# Patient Record
Sex: Male | Born: 1995 | Race: White | Hispanic: No | Marital: Single | Smoking: Never smoker
Health system: Southern US, Community
[De-identification: ages and names within clinical notes are randomized; demographics above are authoritative.]

## PROBLEM LIST (undated history)

## (undated) DIAGNOSIS — D332 Benign neoplasm of brain, unspecified: Secondary | ICD-10-CM

## (undated) DIAGNOSIS — I639 Cerebral infarction, unspecified: Secondary | ICD-10-CM

## (undated) DIAGNOSIS — E079 Disorder of thyroid, unspecified: Secondary | ICD-10-CM

## (undated) HISTORY — PX: VENTRICULOPERITONEAL SHUNT: SHX204

---
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
## 2019-10-02 ENCOUNTER — Other Ambulatory Visit: Payer: Self-pay

## 2019-10-02 ENCOUNTER — Ambulatory Visit: Payer: Self-pay | Attending: Internal Medicine

## 2019-10-02 DIAGNOSIS — Z20822 Contact with and (suspected) exposure to covid-19: Secondary | ICD-10-CM | POA: Insufficient documentation

## 2019-10-03 ENCOUNTER — Telehealth: Payer: Self-pay

## 2019-10-03 LAB — NOVEL CORONAVIRUS, NAA: SARS-CoV-2, NAA: NOT DETECTED

## 2019-10-03 NOTE — Telephone Encounter (Signed)
Provided covid results voiced understanding.

## 2020-12-26 ENCOUNTER — Ambulatory Visit (INDEPENDENT_AMBULATORY_CARE_PROVIDER_SITE_OTHER): Payer: Self-pay

## 2020-12-26 ENCOUNTER — Encounter: Payer: Self-pay | Admitting: Emergency Medicine

## 2020-12-26 ENCOUNTER — Ambulatory Visit
Admission: EM | Admit: 2020-12-26 | Discharge: 2020-12-26 | Disposition: A | Discharge status: Home / Self Care | Payer: Self-pay

## 2020-12-26 DIAGNOSIS — M25471 Effusion, right ankle: Secondary | ICD-10-CM

## 2020-12-26 DIAGNOSIS — M25571 Pain in right ankle and joints of right foot: Secondary | ICD-10-CM

## 2020-12-26 DIAGNOSIS — W19XXXA Unspecified fall, initial encounter: Secondary | ICD-10-CM

## 2020-12-26 DIAGNOSIS — S93491A Sprain of other ligament of right ankle, initial encounter: Secondary | ICD-10-CM

## 2020-12-26 HISTORY — DX: Disorder of thyroid, unspecified: E07.9

## 2020-12-26 HISTORY — DX: Cerebral infarction, unspecified: I63.9

## 2020-12-26 NOTE — Discharge Instructions (Addendum)
Continue icing R ankle for 15 minutes 4 times today and tomorrow Elevate when possible  Follow up with [facility] next week if you are not getting better

## 2020-12-26 NOTE — ED Triage Notes (Signed)
Fell yesterday and while playing ball yesterday, twisted right ankle again on the same day.  Right ankle swelling.

## 2021-09-18 ENCOUNTER — Emergency Department (HOSPITAL_COMMUNITY): Payer: Self-pay

## 2021-09-18 ENCOUNTER — Emergency Department (HOSPITAL_COMMUNITY)
Admission: EM | Admit: 2021-09-18 | Discharge: 2021-09-18 | Disposition: A | Discharge status: Home / Self Care | Payer: Self-pay | Attending: Emergency Medicine | Admitting: Emergency Medicine

## 2021-09-18 ENCOUNTER — Encounter (HOSPITAL_COMMUNITY): Payer: Self-pay | Admitting: *Deleted

## 2021-09-18 DIAGNOSIS — R1032 Left lower quadrant pain: Secondary | ICD-10-CM | POA: Insufficient documentation

## 2021-09-18 DIAGNOSIS — M545 Low back pain, unspecified: Secondary | ICD-10-CM | POA: Insufficient documentation

## 2021-09-18 DIAGNOSIS — E876 Hypokalemia: Secondary | ICD-10-CM | POA: Insufficient documentation

## 2021-09-18 DIAGNOSIS — R110 Nausea: Secondary | ICD-10-CM | POA: Insufficient documentation

## 2021-09-18 DIAGNOSIS — Z79899 Other long term (current) drug therapy: Secondary | ICD-10-CM | POA: Insufficient documentation

## 2021-09-18 LAB — URINALYSIS, ROUTINE W REFLEX MICROSCOPIC
Bilirubin Urine: NEGATIVE
Glucose, UA: NEGATIVE mg/dL
Hgb urine dipstick: NEGATIVE
Ketones, ur: NEGATIVE mg/dL
Leukocytes,Ua: NEGATIVE
Nitrite: NEGATIVE
Protein, ur: NEGATIVE mg/dL
Specific Gravity, Urine: 1.01 (ref 1.005–1.030)
pH: 7 (ref 5.0–8.0)

## 2021-09-18 LAB — CBC WITH DIFFERENTIAL/PLATELET
Abs Immature Granulocytes: 0.02 10*3/uL (ref 0.00–0.07)
Basophils Absolute: 0 10*3/uL (ref 0.0–0.1)
Basophils Relative: 0 %
Eosinophils Absolute: 0.1 10*3/uL (ref 0.0–0.5)
Eosinophils Relative: 2 %
HCT: 44 % (ref 39.0–52.0)
Hemoglobin: 15.7 g/dL (ref 13.0–17.0)
Immature Granulocytes: 0 %
Lymphocytes Relative: 37 %
Lymphs Abs: 2.2 10*3/uL (ref 0.7–4.0)
MCH: 31.5 pg (ref 26.0–34.0)
MCHC: 35.7 g/dL (ref 30.0–36.0)
MCV: 88.2 fL (ref 80.0–100.0)
Monocytes Absolute: 0.4 10*3/uL (ref 0.1–1.0)
Monocytes Relative: 7 %
Neutro Abs: 3.1 10*3/uL (ref 1.7–7.7)
Neutrophils Relative %: 54 %
Platelets: 254 10*3/uL (ref 150–400)
RBC: 4.99 MIL/uL (ref 4.22–5.81)
RDW: 12.7 % (ref 11.5–15.5)
WBC: 5.8 10*3/uL (ref 4.0–10.5)
nRBC: 0 % (ref 0.0–0.2)

## 2021-09-18 LAB — COMPREHENSIVE METABOLIC PANEL
ALT: 26 U/L (ref 0–44)
AST: 20 U/L (ref 15–41)
Albumin: 4.4 g/dL (ref 3.5–5.0)
Alkaline Phosphatase: 81 U/L (ref 38–126)
Anion gap: 8 (ref 5–15)
BUN: 7 mg/dL (ref 6–20)
CO2: 30 mmol/L (ref 22–32)
Calcium: 8.7 mg/dL — ABNORMAL LOW (ref 8.9–10.3)
Chloride: 102 mmol/L (ref 98–111)
Creatinine, Ser: 0.79 mg/dL (ref 0.61–1.24)
GFR, Estimated: >60 mL/min (ref >60)
Glucose, Bld: 86 mg/dL (ref 70–99)
Potassium: 3.1 mmol/L — ABNORMAL LOW (ref 3.5–5.1)
Sodium: 140 mmol/L (ref 135–145)
Total Bilirubin: 0.7 mg/dL (ref 0.3–1.2)
Total Protein: 7.8 g/dL (ref 6.5–8.1)

## 2021-09-18 LAB — LIPASE, BLOOD: Lipase: 43 U/L (ref 11–51)

## 2021-09-18 MED ORDER — IBUPROFEN 600 MG PO TABS: 600.0000 mg | ORAL_TABLET | Freq: Four times a day (QID) | ORAL | 0 refills | Status: AC | PRN

## 2021-09-18 MED ORDER — IOHEXOL 300 MG/ML  SOLN
100.0000 mL | Freq: Once | INTRAMUSCULAR | Status: AC | PRN
  Administered 2021-09-18: 100 mL via INTRAVENOUS

## 2021-09-18 MED ORDER — LACTATED RINGERS IV BOLUS
1000.0000 mL | Freq: Once | INTRAVENOUS | Status: AC
  Administered 2021-09-18: 1000 mL via INTRAVENOUS

## 2021-09-18 MED ORDER — METHOCARBAMOL 500 MG PO TABS: 500.0000 mg | ORAL_TABLET | Freq: Two times a day (BID) | ORAL | 0 refills | Status: AC

## 2021-09-18 MED ORDER — MORPHINE SULFATE (PF) 4 MG/ML IV SOLN
4.0000 mg | Freq: Once | INTRAVENOUS | Status: AC
  Administered 2021-09-18: 4 mg via INTRAVENOUS
  Filled 2021-09-18: qty 1

## 2021-09-18 NOTE — ED Provider Notes (Signed)
[facility] EMERGENCY DEPARTMENT Provider Note   CSN: [id number] Arrival date & time: 09/18/21  1223     History Chief Complaint  Patient presents with   Abdominal Pain    Derrick Morrison is a 26 y.o. male with history of stroke and hypopituitarism presents to the ED for evaluation of left lower back pain and LLQ pain since yesterday. The patient reports that he doesn't know where the pain is coming from, but keeps moving "back and forth". Family member in the room mentions that he had a bowel movement on himself yesterday which is not typical for him. The patient reports nausea and belching. Denies any vomiting. Denies any dysuria, hematuria, testicular pain/swelling, scrotal pain/swelling, penile pain/swelling. Denies any urinary incontinence or retention. FM denies any fevers. FM reports that he gave him "some gas pills" earlier today with no relief of pain. The patient has right upper extremitiy deficits at baseline from previous stroke. Medical history as listed above. Multiple brain surgeries for tumor removal. Daily medication include synthyroid and lamosil. NKDA. Up to date on childhood vaccinations. Denies any tobacco, EtOH, or illicit drug use ever.    Abdominal Pain Associated symptoms: nausea   Associated symptoms: no chest pain, no constipation, no dysuria, no fever, no hematuria, no shortness of breath and no vomiting       Home Medications Prior to Admission medications   Medication Sig Start Date End Date Taking? Authorizing Provider  levothyroxine (SYNTHROID) 50 MCG tablet Take 50 mcg by mouth daily before breakfast.    [provider]      Allergies    Patient has no known allergies.    Review of Systems   Review of Systems  Constitutional:  Negative for fever.  HENT:  Negative for congestion and rhinorrhea.   Respiratory:  Negative for shortness of breath.   Cardiovascular:  Negative for chest pain.  Gastrointestinal:  Positive for abdominal pain and  nausea. Negative for blood in stool, constipation and vomiting.  Genitourinary:  Negative for dysuria, frequency, hematuria, penile discharge, penile pain, penile swelling, scrotal swelling and testicular pain.  Musculoskeletal:  Positive for back pain.  All other systems reviewed and are negative.  Physical Exam Updated Vital Signs BP (!) 128/96 (BP Location: Left Arm)    Pulse 88    Temp (!) 97.4 F (36.3 C) (Oral)    Resp 20    SpO2 100%  Physical Exam Vitals and nursing note reviewed.  Constitutional:      General: He is not in acute distress.    Appearance: Normal appearance. He is not toxic-appearing.  HENT:     Head: Normocephalic and atraumatic.  Eyes:     General: No scleral icterus. Cardiovascular:     Rate and Rhythm: Normal rate and regular rhythm.  Pulmonary:     Effort: Pulmonary effort is normal. No respiratory distress.     Breath sounds: Normal breath sounds.  Abdominal:     General: Abdomen is flat. Bowel sounds are normal.     Palpations: Abdomen is soft.     Tenderness: There is no abdominal tenderness.     Comments: No overlying skin changes, erythema, ecchymosis, rash, or abrasion noted to the area. Mild LLQ tenderness to palpation. No rebounding or guarding. NBS. Abdomen is soft and non distended.   Musculoskeletal:        General: No deformity.     Cervical back: Normal range of motion.     Comments: No midlines or paraspinal  tenderness to palpation of the cervical, thoracic, lumbar, or sacrum. No overlying skin changes visualized. No bony deformities or step offs palpated or visualized. No CVA tenderness.   Skin:    General: Skin is warm and dry.  Neurological:     Mental Status: He is alert. Mental status is at baseline.     Comments: RUE contracture and weakness. Strength 5/5 in LUE and bilateral lower extremities. DP and PT pulses intact. Sensation intact. Compartments are soft.     ED Results / Procedures / Treatments   Labs (all labs ordered are  listed, but only abnormal results are displayed) Labs Reviewed  COMPREHENSIVE METABOLIC PANEL - Abnormal; Notable for the following components:      Result Value   Potassium 3.1 (*)    Calcium 8.7 (*)    All other components within normal limits  LIPASE, BLOOD  CBC WITH DIFFERENTIAL/PLATELET  URINALYSIS, ROUTINE W REFLEX MICROSCOPIC    EKG None  Radiology CT ABDOMEN PELVIS W CONTRAST  Result Date: 09/18/2021 CLINICAL DATA:  Left lower quadrant abdominal pain EXAM: CT ABDOMEN AND PELVIS WITH CONTRAST TECHNIQUE: Multidetector CT imaging of the abdomen and pelvis was performed using the standard protocol following bolus administration of intravenous contrast. RADIATION DOSE REDUCTION: This exam was performed according to the departmental dose-optimization program which includes automated exposure control, adjustment of the mA and/or kV according to patient size and/or use of iterative reconstruction technique. CONTRAST:  134mL OMNIPAQUE IOHEXOL 300 MG/ML  SOLN COMPARISON:  None. FINDINGS: Lower chest: No acute abnormality. Hepatobiliary: Diffuse low attenuation of hepatic parenchyma concerning for hepatic steatosis. No focal hepatic lesion. No gallstones, gallbladder wall thickening, or biliary dilatation. Pancreas: Unremarkable. No pancreatic ductal dilatation or surrounding inflammatory changes. Spleen: Normal in size without focal abnormality. Adrenals/Urinary Tract: Adrenal glands are unremarkable. Kidneys are normal, without renal calculi, focal lesion, or hydronephrosis. Bladder is unremarkable. Stomach/Bowel: Stomach is within normal limits. Appendix appears normal. No evidence of bowel wall thickening, distention, or inflammatory changes. Vascular/Lymphatic: No significant vascular findings are present. No enlarged abdominal or pelvic lymph nodes. Reproductive: Prostate is unremarkable. Other: VP shunt with distal tip in the left anterior pelvis. No abdominal wall hernia. No ascites.  Musculoskeletal: No acute or significant osseous findings. IMPRESSION: 1.  No CT evidence of acute abdominal/pelvic process. 2.  Hepatic steatosis. 3. VP shunt with distal tip in the left anterior pelvis without evidence of complications. Electronically Signed   By: Keane Police D.O.   On: 09/18/2021 16:49    Procedures Procedures  Mildly elevated BP, hemodynamically stable.   Medications Ordered in ED Medications  lactated ringers bolus 1,000 mL (0 mLs Intravenous Stopped 09/18/21 1645)  morphine 4 MG/ML injection 4 mg (4 mg Intravenous Given 09/18/21 1534)  iohexol (OMNIPAQUE) 300 MG/ML solution 100 mL (100 mLs Intravenous Contrast Given 09/18/21 1617)    ED Course/ Medical Decision Making/ A&P                           Medical Decision Making  26 y/o M presents to the ED for evaluation of abdominal pain/back pain. Differential diagnosis includes but is lot limited to colitis, diverticulitis, viral gastroenteritis, SBO, sciatica, cauda equina. Low suspicion for cauda equina as he moves all extremities spontaneously and he doesn't endorse any midline tenderness. Vital signs show mild elevated blood pressure, otherwise unremarkable. Physical exam pertinent for some mild LLQ tenderness to palpation.  Otherwise, no paraspinal or midline tenderness palpation.  No overlying skin lesions noted to the abdomen or back.  Normal active bowel sounds.  Abdomen soft and nondistended.  Will order basic labs and imaging. Morphine and fluids given.  I ordered and independently interpreted the patient's labs.  CBC shows no leukocytosis or anemia.  Lipase normal. Urinalysis normal. CMP shows mild hypokalemia at 3.1 and mild hypocalcemia at 8.7 otherwise no other electrolyte abnormalities.   I ordered and independently reviewed the patient's CT abdomen pelvis and agree with the radiologist's findings.  No evidence of acute abdominal process.  No abnormality seen in the left lower quadrant to explain pain.  Radiologist finding of: VP shunt with distal tip in the left anterior pelvis without evidence of complications.  As I return to the room to discussed the lab and imaging findings with the patient and family members, one of the members reports that they forgot to mention that he did in fact fall yesterday.  Because of his throat, he does not have a steady gait.  The primary mentions that he was in a camper and tripped landing on his left side.  Given this new information along with benign imaging and labs, this is likely musculoskeletal in etiology.  Patient does not meet admission criteria.  We will place the patient on ibuprofen and Robaxin.  Return precautions discussed with family members and patient.  They agree to plan.  Patient is stable and being discharged home in good condition.  Final Clinical Impression(s) / ED Diagnoses Final diagnoses:  Left lower quadrant abdominal pain  Acute left-sided low back pain without sciatica    Rx / DC Orders ED Discharge Orders          Ordered    ibuprofen (ADVIL) 600 MG tablet  Every 6 hours PRN        09/18/21 1836    methocarbamol (ROBAXIN) 500 MG tablet  2 times daily        09/18/21 1836              Sherrell Puller, PA-C 09/20/21 2354    Noemi Chapel, MD 09/23/21 [phone]

## 2021-09-18 NOTE — ED Triage Notes (Signed)
Left lower quadrant abdominal pain onset last night

## 2021-09-18 NOTE — Discharge Instructions (Addendum)
You were seen here today for evaluation of your back and abdominal pain. Your labs and imaging were unremarkable. I have prescribed you ibuprofen and robaxin to take as needed for pain. If you have any worsening pain, bowel or bladder incontinence, fevers, or worsening weakness, please return to the nearest ER for re-evaluation.

## 2021-12-26 ENCOUNTER — Encounter (HOSPITAL_COMMUNITY): Payer: Self-pay

## 2021-12-26 ENCOUNTER — Emergency Department (HOSPITAL_COMMUNITY)
Admission: EM | Admit: 2021-12-26 | Discharge: 2021-12-26 | Disposition: A | Discharge status: Home / Self Care | Payer: Self-pay | Attending: Emergency Medicine | Admitting: Emergency Medicine

## 2021-12-26 ENCOUNTER — Emergency Department (HOSPITAL_COMMUNITY): Payer: Self-pay

## 2021-12-26 ENCOUNTER — Other Ambulatory Visit: Payer: Self-pay

## 2021-12-26 DIAGNOSIS — H53459 Other localized visual field defect, unspecified eye: Secondary | ICD-10-CM | POA: Insufficient documentation

## 2021-12-26 DIAGNOSIS — E876 Hypokalemia: Secondary | ICD-10-CM | POA: Insufficient documentation

## 2021-12-26 DIAGNOSIS — R0789 Other chest pain: Secondary | ICD-10-CM | POA: Insufficient documentation

## 2021-12-26 DIAGNOSIS — R519 Headache, unspecified: Secondary | ICD-10-CM | POA: Insufficient documentation

## 2021-12-26 DIAGNOSIS — Q670 Congenital facial asymmetry: Secondary | ICD-10-CM | POA: Insufficient documentation

## 2021-12-26 DIAGNOSIS — M436 Torticollis: Secondary | ICD-10-CM | POA: Insufficient documentation

## 2021-12-26 DIAGNOSIS — R278 Other lack of coordination: Secondary | ICD-10-CM | POA: Insufficient documentation

## 2021-12-26 DIAGNOSIS — R2689 Other abnormalities of gait and mobility: Secondary | ICD-10-CM | POA: Insufficient documentation

## 2021-12-26 DIAGNOSIS — J029 Acute pharyngitis, unspecified: Secondary | ICD-10-CM | POA: Insufficient documentation

## 2021-12-26 HISTORY — DX: Benign neoplasm of brain, unspecified: D33.2

## 2021-12-26 LAB — BASIC METABOLIC PANEL
Anion gap: 8 (ref 5–15)
BUN: 8 mg/dL (ref 6–20)
CO2: 28 mmol/L (ref 22–32)
Calcium: 8.8 mg/dL — ABNORMAL LOW (ref 8.9–10.3)
Chloride: 106 mmol/L (ref 98–111)
Creatinine, Ser: 0.6 mg/dL — ABNORMAL LOW (ref 0.61–1.24)
GFR, Estimated: >60 mL/min (ref >60)
Glucose, Bld: 75 mg/dL (ref 70–99)
Potassium: 3.1 mmol/L — ABNORMAL LOW (ref 3.5–5.1)
Sodium: 142 mmol/L (ref 135–145)

## 2021-12-26 LAB — CBC WITH DIFFERENTIAL/PLATELET
Abs Immature Granulocytes: 0.01 10*3/uL (ref 0.00–0.07)
Basophils Absolute: 0 10*3/uL (ref 0.0–0.1)
Basophils Relative: 0 %
Eosinophils Absolute: 0.1 10*3/uL (ref 0.0–0.5)
Eosinophils Relative: 2 %
HCT: 41.8 % (ref 39.0–52.0)
Hemoglobin: 14.5 g/dL (ref 13.0–17.0)
Immature Granulocytes: 0 %
Lymphocytes Relative: 40 %
Lymphs Abs: 2.4 10*3/uL (ref 0.7–4.0)
MCH: 30.1 pg (ref 26.0–34.0)
MCHC: 34.7 g/dL (ref 30.0–36.0)
MCV: 86.9 fL (ref 80.0–100.0)
Monocytes Absolute: 0.4 10*3/uL (ref 0.1–1.0)
Monocytes Relative: 7 %
Neutro Abs: 3.1 10*3/uL (ref 1.7–7.7)
Neutrophils Relative %: 51 %
Platelets: 229 10*3/uL (ref 150–400)
RBC: 4.81 MIL/uL (ref 4.22–5.81)
RDW: 12.4 % (ref 11.5–15.5)
WBC: 6 10*3/uL (ref 4.0–10.5)
nRBC: 0 % (ref 0.0–0.2)

## 2021-12-26 LAB — TROPONIN I (HIGH SENSITIVITY)
Troponin I (High Sensitivity): <2 ng/L (ref <18)
Troponin I (High Sensitivity): <2 ng/L (ref <18)

## 2021-12-26 MED ORDER — POTASSIUM CHLORIDE CRYS ER 10 MEQ PO TBCR: 20.0000 meq | EXTENDED_RELEASE_TABLET | Freq: Two times a day (BID) | ORAL | 0 refills | Status: AC

## 2021-12-26 MED ORDER — POTASSIUM CHLORIDE CRYS ER 20 MEQ PO TBCR
40.0000 meq | EXTENDED_RELEASE_TABLET | Freq: Once | ORAL | Status: AC
  Administered 2021-12-26: 40 meq via ORAL
  Filled 2021-12-26: qty 2

## 2021-12-26 MED ORDER — IOHEXOL 300 MG/ML  SOLN
75.0000 mL | Freq: Once | INTRAMUSCULAR | Status: AC | PRN
  Administered 2021-12-26: 75 mL via INTRAVENOUS

## 2021-12-26 NOTE — ED Triage Notes (Addendum)
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  VP shunt goes around right side of neck.  Patient is seen at [facility].  Patient is complaining of sore throat and had migraine yesterday.  Just finished antbx and steriods for sinus infection.  Pain in his neck when he turns his head to the left.  Mother reports having cp pain and difficulty taking deep breath. ?Mother reports he is never sick and has been the past week.  Patient complains of sternal pain.  Mother is requesting head CT to evaluate shunt.  ?

## 2021-12-26 NOTE — ED Provider Notes (Signed)
?[facility] ?Provider Note ? ? ?CSN: [id number] ?Arrival date & time: 12/26/21  1306 ? ?  ? ?History ? ?Chief Complaint  ?Patient presents with  ? Sore Throat  ? Torticollis  ? ? ?Derrick Morrison is a 26 y.o. male with chief complaint of sore throat and migraine.  Recently had a sinus infection over the last week and just finished antibiotics/steroids for this.  Denies cough, fever, postnasal drip.  Endorses mild lingering congestion and new sore throat that began yesterday.  Notes intermittent difficulty swallowing, and the feeling as if there is a lump in his throat.  Feels as though the left side of his throat is tender and swollen, and that the lump is palpable.  Noted that he has headaches that restarted 2 days ago, and usually when he has these he has to have his shunt rechecked per patient's mother.  Denies vision changes, balance changes, or other new neurodeficits.  Denies hearing changes or ear pain.  Denies recent trauma or cervical neck pain.  Also complaining of intermittent, non-characterized sternal pain and pain upon inspiration.  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  Currently followed by [facility]. ? ?The history is provided by the patient and medical records.  ?Sore Throat ?Associated symptoms include headaches.  ? ?  ? ?Home Medications ?Prior to Admission medications   ?Medication Sig Start Date End Date Taking? Authorizing Provider  ?ibuprofen (ADVIL) 600 MG tablet Take 1 tablet (600 mg total) by mouth every 6 (six) hours as needed. 09/18/21  Yes Sherrell Puller, PA-C  ?levothyroxine (SYNTHROID) 50 MCG tablet Take 50 mcg by mouth daily before breakfast.   Yes [provider]  ?pantoprazole (PROTONIX) 20 MG tablet Take 20 mg by mouth daily. 12/21/21  Yes [provider]  ?potassium chloride SA (KLOR-CON M) 10 MEQ tablet Take 2 tablets (20 mEq total) by  mouth 2 (two) times daily for 4 days. 12/26/21 12/30/21 Yes Prince Rome, PA-C  ?methocarbamol (ROBAXIN) 500 MG tablet Take 1 tablet (500 mg total) by mouth 2 (two) times daily. ?Patient not taking: Reported on 12/26/2021 09/18/21   Sherrell Puller, PA-C  ?   ? ?Allergies    ?Codeine   ? ?Review of Systems   ?Review of Systems  ?HENT:  Positive for congestion, sore throat and trouble swallowing (Intermittent).   ?Neurological:  Positive for headaches.  ? ?Physical Exam ?Updated Vital Signs ?BP (!) 132/91   Pulse 87   Temp 97.6 ?F (36.4 ?C) (Oral)   Resp 18   Wt 87.8 kg   SpO2 99%  ?Physical Exam ?Vitals and nursing note reviewed.  ?Constitutional:   ?   General: He is not in acute distress. ?   Appearance: He is well-developed. He is not ill-appearing or diaphoretic.  ?   Comments: Per family members patient appears at baseline  ?HENT:  ?   Head: Normocephalic and atraumatic.  ? ?   Comments: Head pain localized during recent headaches per patient as depicted above, not worsened with palpation ?   Nose: No congestion or rhinorrhea.  ?   Mouth/Throat:  ?   Mouth: Mucous membranes are moist.  ?   Pharynx: Oropharynx is clear. Uvula midline. No pharyngeal swelling, oropharyngeal exudate, posterior oropharyngeal erythema or uvula swelling.  ?   Tonsils: No tonsillar exudate or tonsillar abscesses.  ?Eyes:  ?   General: Lids are normal. Vision  grossly intact. Visual field deficit present.  ?   Extraocular Movements:  ?   Right eye: Abnormal extraocular motion present.  ?   Left eye: Abnormal extraocular motion present.  ?   Conjunctiva/sclera: Conjunctivae normal.  ?   Right eye: Right conjunctiva is not injected.  ?   Left eye: Left conjunctiva is not injected.  ?   Comments: Abnormal EOMs at baseline, pre-existing CN III palsy ?Right eyelid droops at baseline  ?Cardiovascular:  ?   Rate and Rhythm: Normal rate and regular rhythm.  ?   Heart sounds: Normal heart sounds. No murmur heard. ?Pulmonary:  ?   Effort:  Pulmonary effort is normal. No respiratory distress.  ?   Breath sounds: Normal breath sounds. No wheezing.  ?Chest:  ?   Chest wall: Tenderness present. No mass, lacerations, deformity, swelling, crepitus or edema.  ? ? ?   Comments: Chest TTP as depicted above ?Abdominal:  ?   Palpations: Abdomen is soft.  ?   Tenderness: There is no abdominal tenderness.  ?Musculoskeletal:     ?   General: No swelling.  ?   Cervical back: Neck supple.  ?Lymphadenopathy:  ?   Cervical: Cervical adenopathy (Left cervical) present.  ?Skin: ?   General: Skin is warm and dry.  ?   Capillary Refill: Capillary refill takes less than 2 seconds.  ?Neurological:  ?   Mental Status: He is alert and oriented to person, place, and time.  ?   GCS: GCS eye subscore is 4. GCS verbal subscore is 5. GCS motor subscore is 6.  ?   Cranial Nerves: Cranial nerve deficit and facial asymmetry present. No dysarthria.  ?   Sensory: Sensation is intact. No sensory deficit.  ?   Motor: Weakness present. No tremor or seizure activity.  ?   Coordination: Coordination abnormal.  ?   Gait: Gait abnormal.  ?   Comments: Facial asymmetry, visual field deficits, weakness of the right upper and lower extremity, abnormal coordination of the right upper and lower extremity, and abnormal gait are all pre-existing and to the level of baseline per patient family members.  Difficult to distinguish new from old neurodeficits.  ?Psychiatric:     ?   Mood and Affect: Mood normal.  ? ? ?ED Results / Procedures / Treatments   ?Labs ?(all labs ordered are listed, but only abnormal results are displayed) ?Labs Reviewed  ?BASIC METABOLIC PANEL - Abnormal; Notable for the following components:  ?    Result Value  ? Potassium 3.1 (*)   ? Creatinine, Ser 0.60 (*)   ? Calcium 8.8 (*)   ? All other components within normal limits  ?CBC WITH DIFFERENTIAL/PLATELET  ?TROPONIN I (HIGH SENSITIVITY)  ?TROPONIN I (HIGH SENSITIVITY)  ? ? ?EKG ?EKG Interpretation ? ?Date/Time:  Saturday December 26 2021 13:17:20 EDT ?Ventricular Rate:  93 ?PR Interval:  146 ?QRS Duration: 82 ?QT Interval:  374 ?QTC Calculation: 465 ?R Axis:   -25 ?Text Interpretation: Normal sinus rhythm with sinus arrhythmia Normal ECG No previous ECGs available Confirmed by Noemi Chapel [phone]) on 12/26/2021 1:21:55 PM ? ?Radiology ?CT Head Wo Contrast ? ?Result Date: 12/26/2021 ?CLINICAL DATA:  Headache. EXAM: CT HEAD WITHOUT CONTRAST TECHNIQUE: Contiguous axial images were obtained from the base of the skull through the vertex without intravenous contrast. RADIATION DOSE REDUCTION: This exam was performed according to the departmental dose-optimization program which includes automated exposure control, adjustment of the mA and/or kV according to patient  size and/or use of iterative reconstruction technique. COMPARISON:  July 01, 2006 FINDINGS: Brain: No evidence of acute infarction, hemorrhage, or hydrocephalus. VP shunt in stable position. Stable ventricular system. 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 Prominence of the right internal carotid artery, stable. Stable lacunar infarct of the left thalamus. Vascular: No unexpected calcification. Skull: Negative for fracture or focal lesion. Sinuses/Orbits: No acute finding. Other: None. IMPRESSION: 1. No acute intracranial abnormality. 2. VP shunt in stable position. Stable ventricular system. 3. Stable suprasellar cystic lesion. 4. Prominence of the right internal carotid artery, stable. Electronically Signed   By: Fidela Salisbury M.D.   On: 12/26/2021 17:17  ? ?CT Soft Tissue Neck W Contrast ? ?Result Date: 12/26/2021 ?CLINICAL DATA:  Provided history: Soft tissue swelling, infection suspected, neck x-ray done. Additional history provided: Assess for retropharyngeal abscess, parapharyngeal abscess, tonsillar abscess. History of brain tumor. EXAM: CT NECK WITH CONTRAST TECHNIQUE: Multidetector CT imaging of the neck was performed using the standard protocol following the bolus  administration of intravenous contrast. RADIATION DOSE REDUCTION: This exam was performed according to the departmental dose-optimization program which includes automated exposure control, adjustment of the mA

## 2021-12-26 NOTE — Discharge Instructions (Signed)
Please follow-up with your primary care within the next 3 to 5 days for reevaluation and continued medical management ? ?Also follow-up with your neurology specialist at [facility] as discussed within the next week or so ? ?Return to the ED for new or worsening symptoms as discussed ?

## 2022-11-25 IMAGING — CT CT HEAD W/O CM
3 of 4 series · 15 of 47 positions shown, 18 images · non-contrast
Comparison: July 01, 2006

CLINICAL DATA: Headache.



[Series 3: head w o · axial · 0.46mm/px · z∈[-22,+118]mm · 9 of 34 slices shown, 12 images]
[im 3/34  brain]
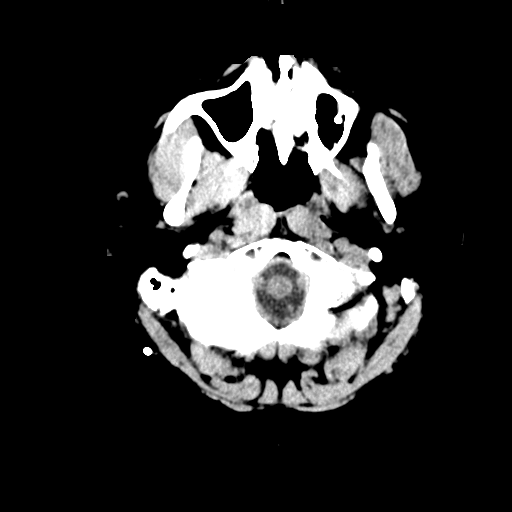
[im 3/34  bone]
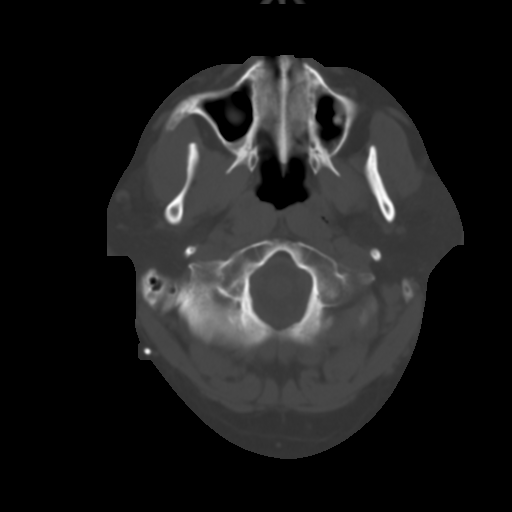
[im 8/34  brain]
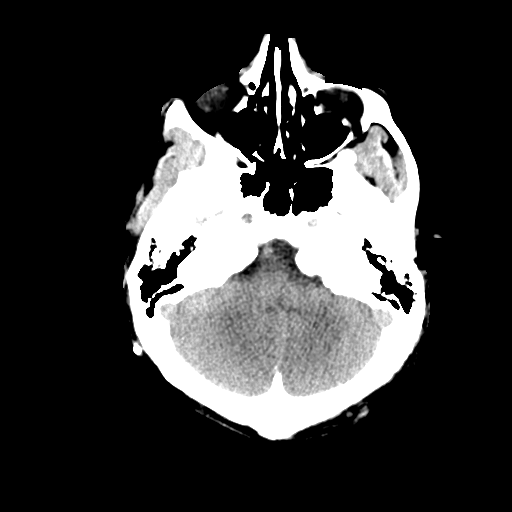
[im 10/34  brain]
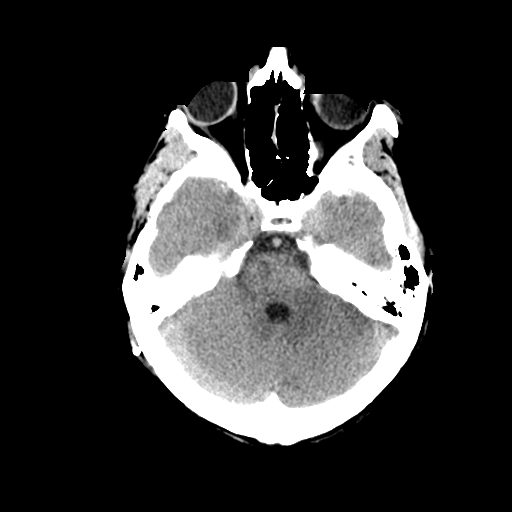
[im 15/34  brain]
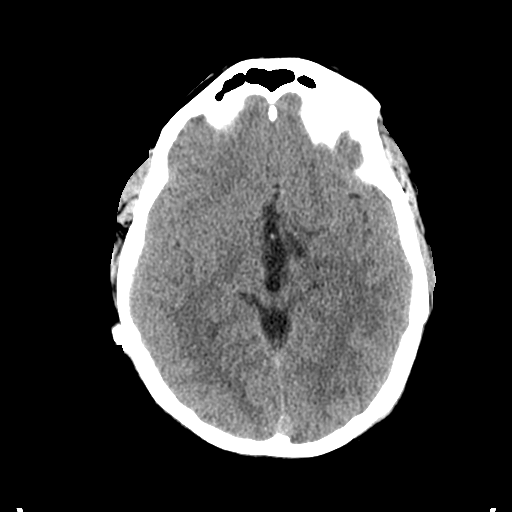
[im 17/34  brain]
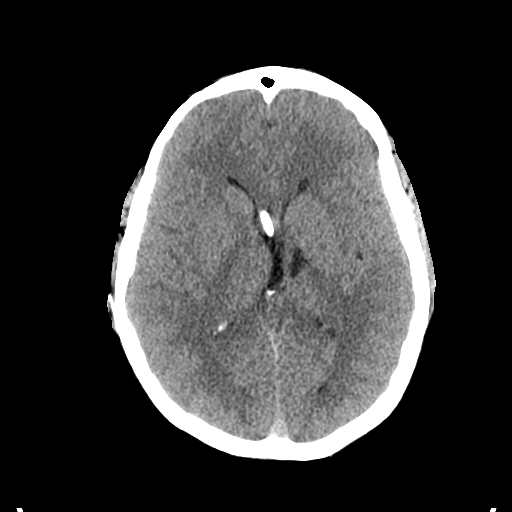
[im 17/34  bone]
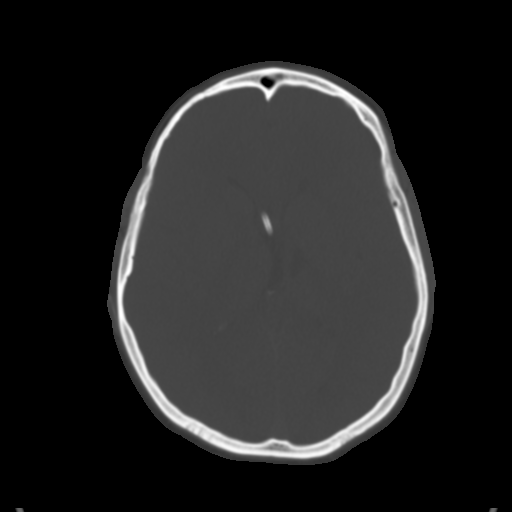
[im 19/34  brain]
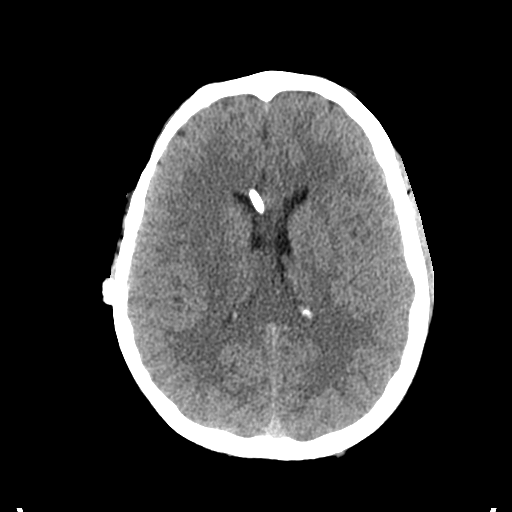
[im 24/34  brain]
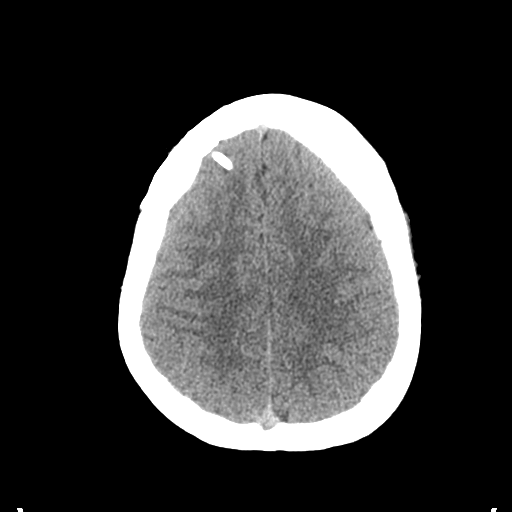
[im 26/34  brain]
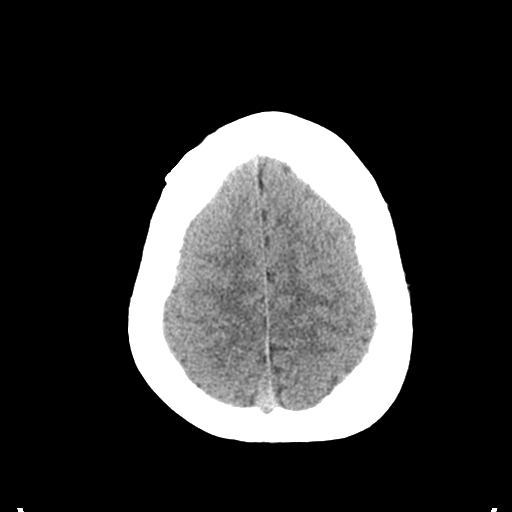
[im 31/34  brain]
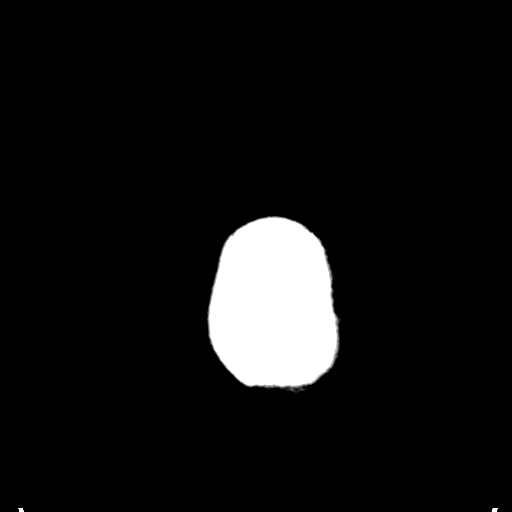
[im 31/34  bone]
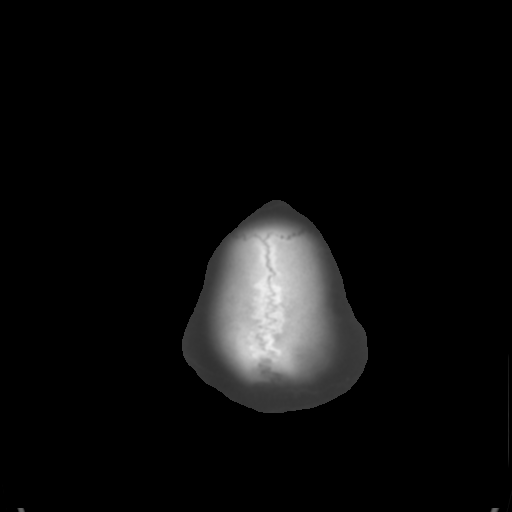

[Series 4: coronal soft · coronal · 0.33mm/px · 3 of 74 slices shown]
[im 25/74  brain]
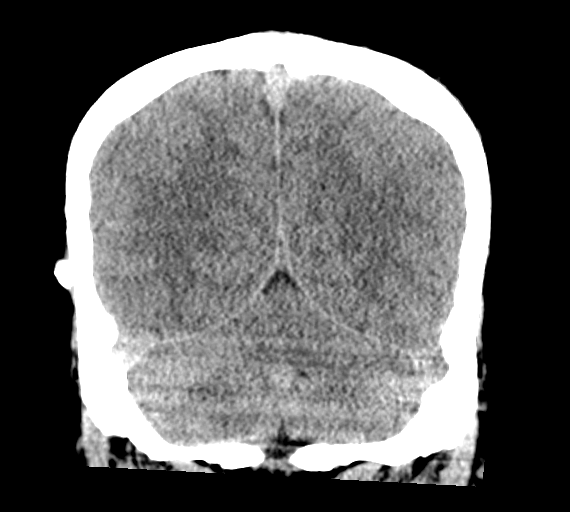
[im 33/74  brain]
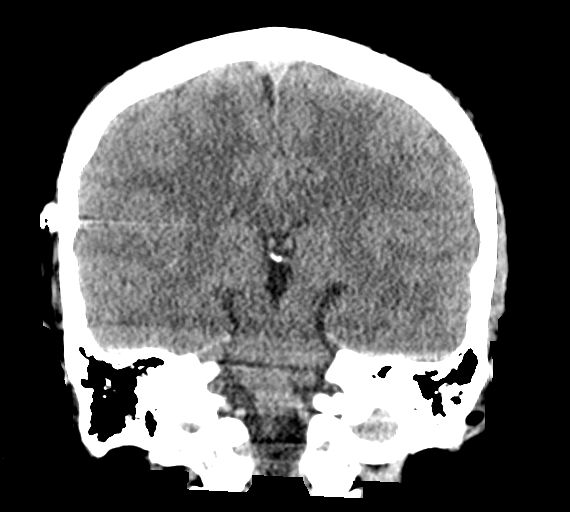
[im 41/74  brain]
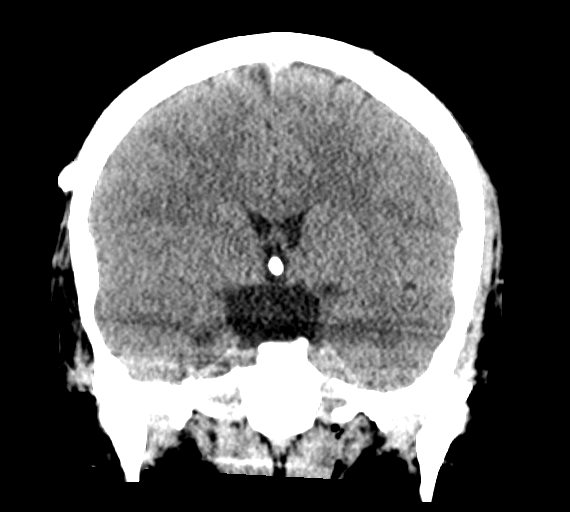

[Series 5: sagittal soft · sagittal · 0.34mm/px · 3 of 62 slices shown]
[im 21/62  brain]
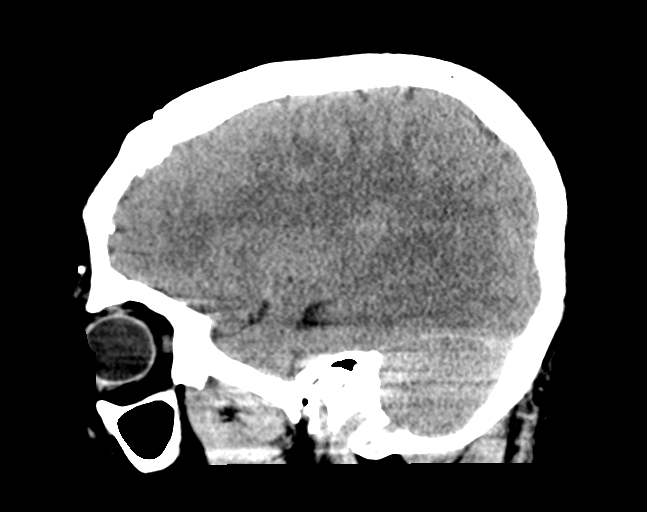
[im 31/62  brain]
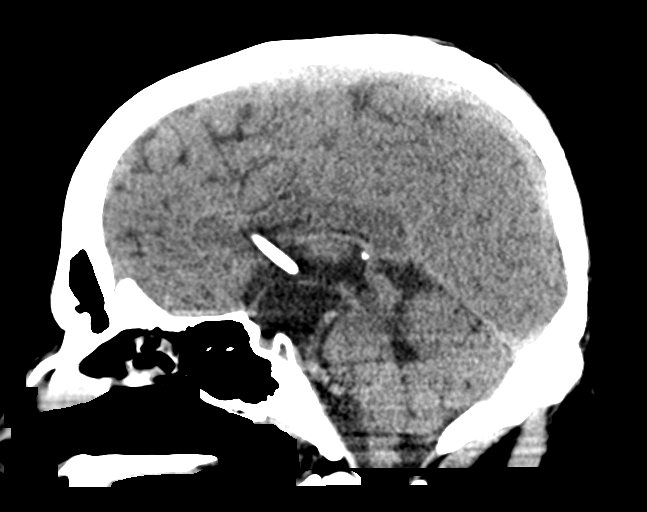
[im 41/62  brain]
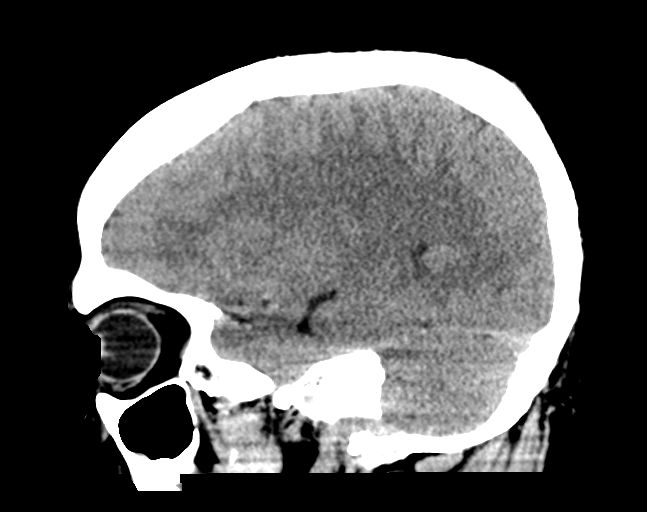

[15 of 47 positions shown; findings below may reference images not displayed]

FINDINGS: Brain: No evidence of acute infarction, hemorrhage, or
hydrocephalus. VP shunt in stable position. Stable ventricular
system. The suprasellar cystic lesion is unchanged. Prominence of
the right internal carotid artery, stable. Stable lacunar infarct of
the left thalamus.

Vascular: No unexpected calcification.

Skull: Negative for fracture or focal lesion.

Sinuses/Orbits: No acute finding.

Other: None.
IMPRESSION: 1. No acute intracranial abnormality.
2. VP shunt in stable position. Stable ventricular system.
3. Stable suprasellar cystic lesion.
4. Prominence of the right internal carotid artery, stable.

## 2022-11-25 IMAGING — CT CT NECK W/ CM
4 of 5 series · 14 of 35 positions shown, 16 images · IV contrast (Omnipaque or Isovue)
Comparison: None.

CLINICAL DATA: Provided history: Soft tissue swelling, infection
suspected, neck x-ray done. Additional history provided: Assess for
retropharyngeal abscess, parapharyngeal abscess, tonsillar abscess.
History of brain tumor.

EXAM:
CT NECK WITH CONTRAST
TECHNIQUE: Multidetector CT imaging of the neck was performed using the
standard protocol following the bolus administration of intravenous
contrast.

[Series 4: axial neck · axial · 0.51mm/px · z∈[-171,-63]mm · 3 of 110 slices shown]
[im 28/110  bone]
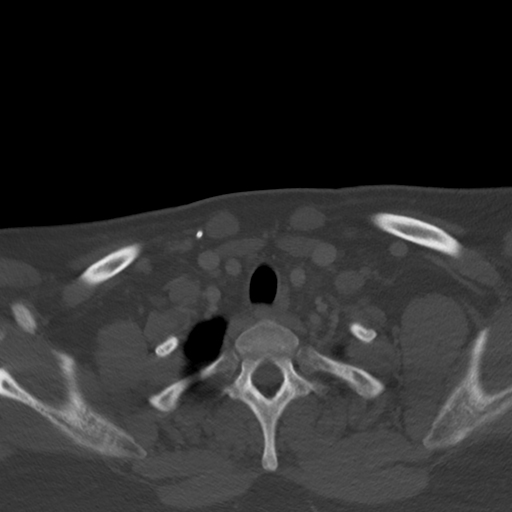
[im 55/110  bone]
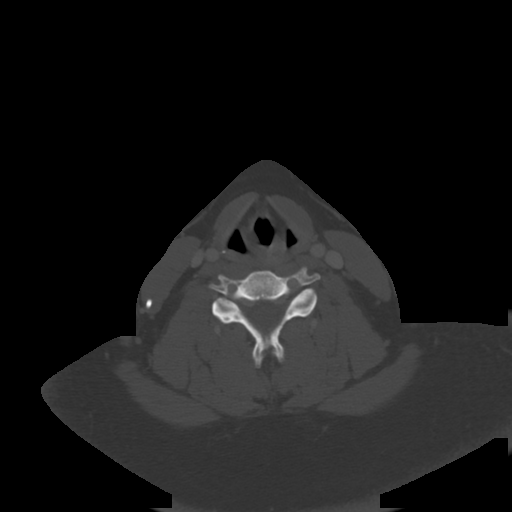
[im 82/110  bone]
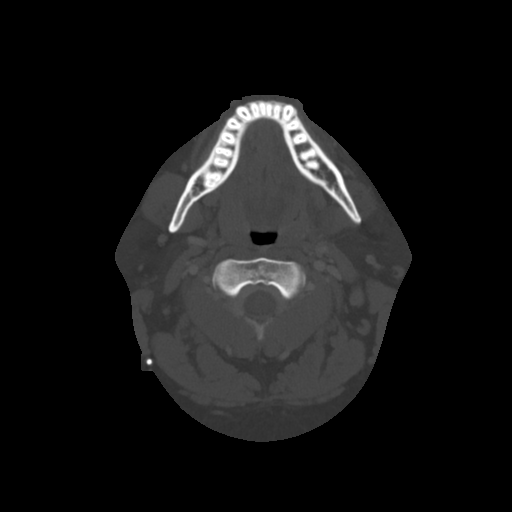

[Series 6: cor neck · coronal · 0.45mm/px · 3 of 126 slices shown]
[im 26/126  bone]
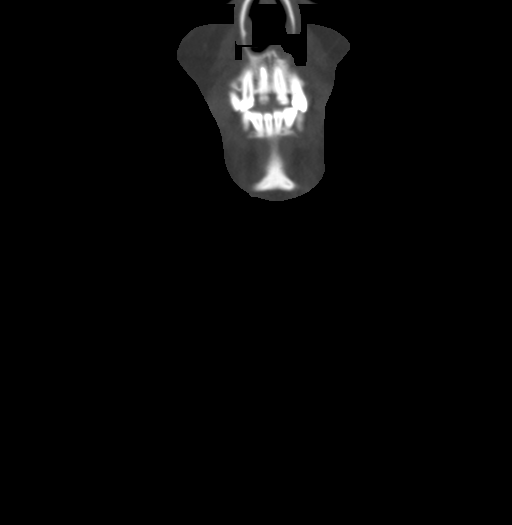
[im 51/126  bone]
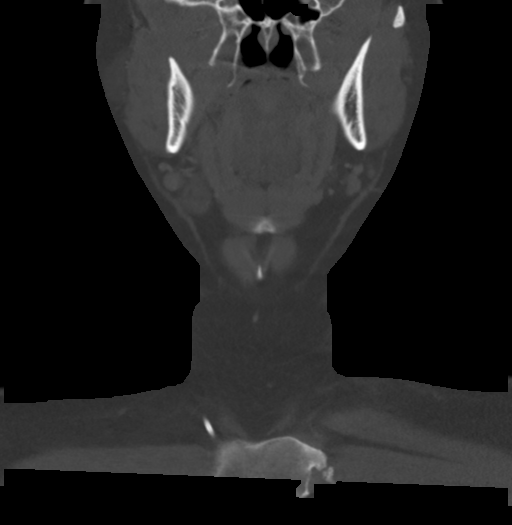
[im 76/126  bone]
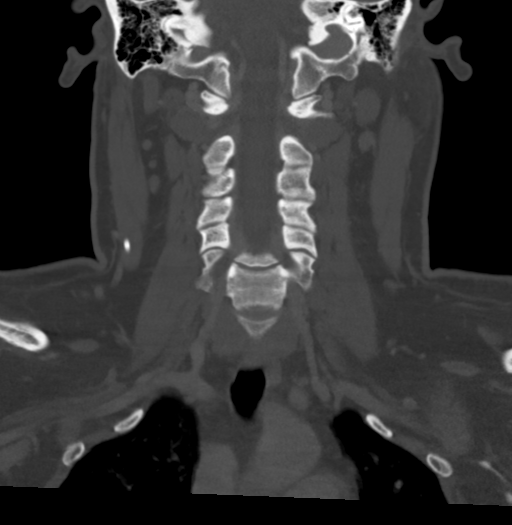

[Series 7: sag neck · sagittal · 0.47mm/px · 5 of 108 slices shown, 6 images]
[im 36/108  bone]
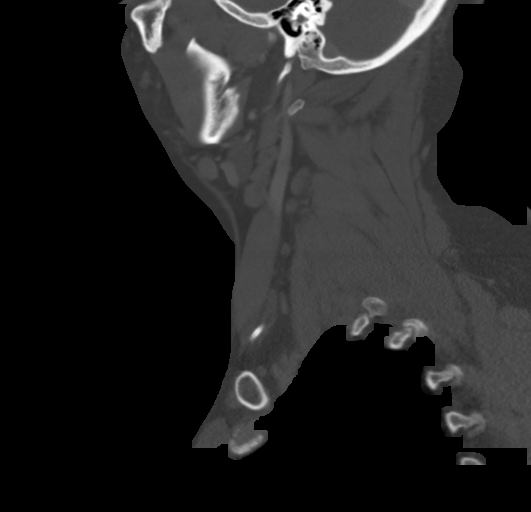
[im 45/108  bone]
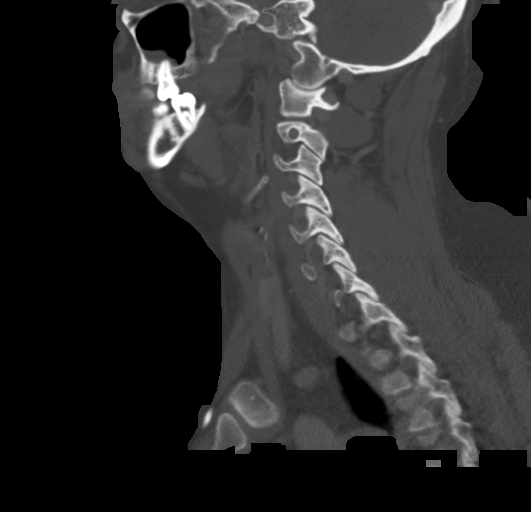
[im 54/108  soft-tissue]
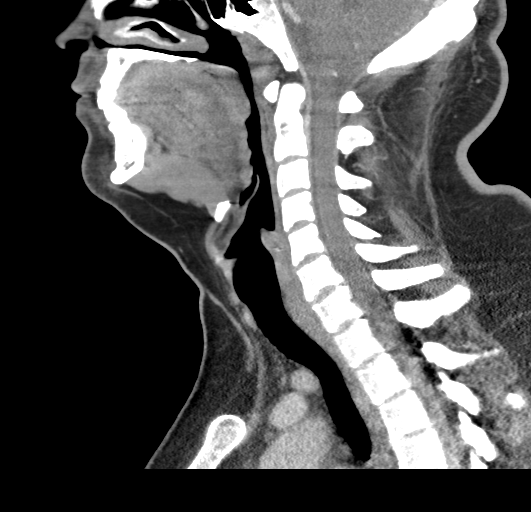
[im 54/108  bone]
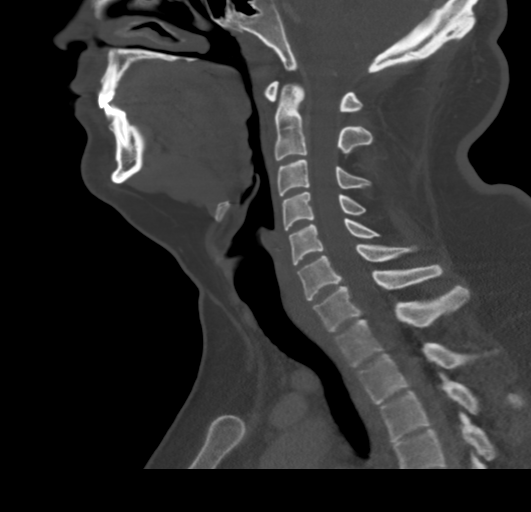
[im 63/108  bone]
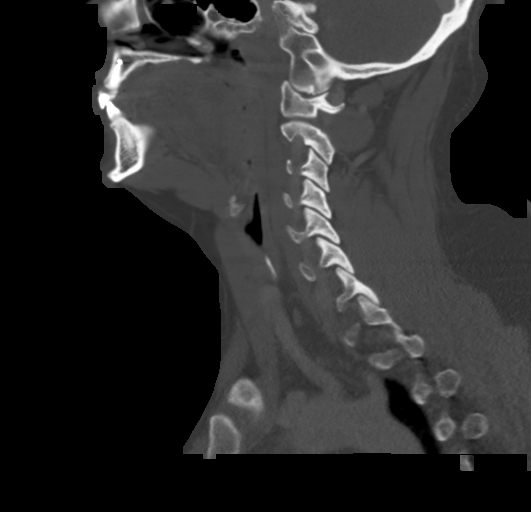
[im 72/108  bone]
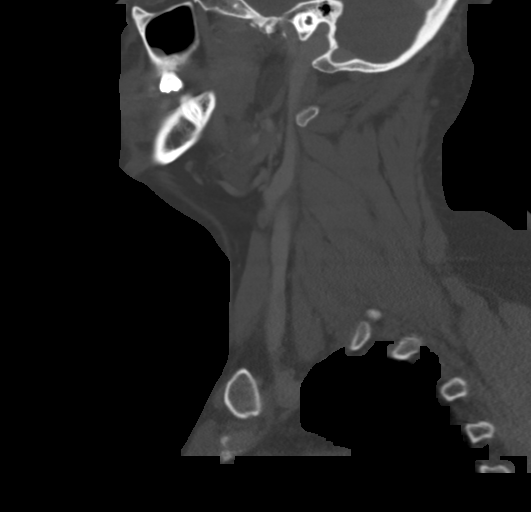

[Series 8: ax oropharynx (person_name) · axial · 0.40mm/px · z∈[-265,-136]mm · 3 of 136 slices shown, 4 images]
[im 34/136  soft-tissue]
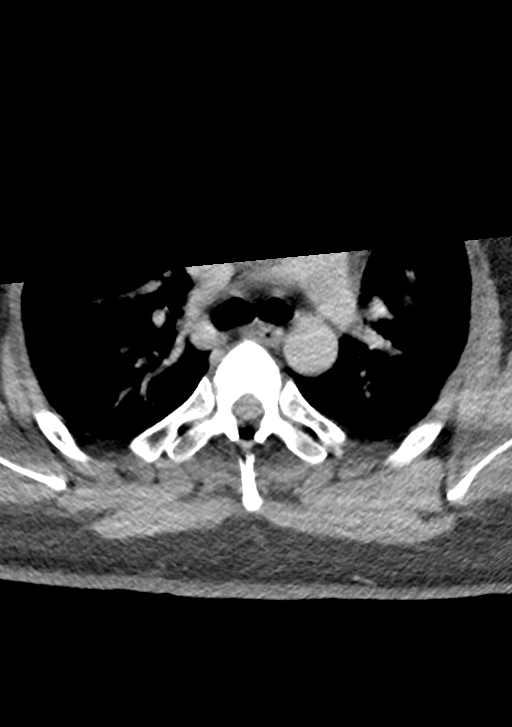
[im 34/136  bone]
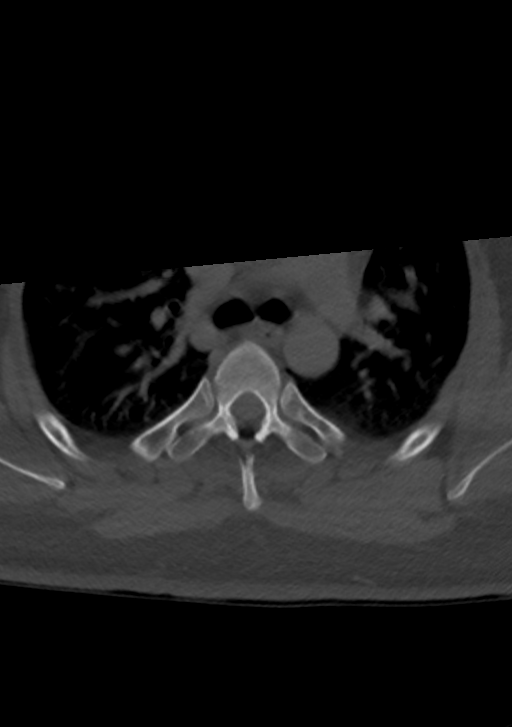
[im 68/136  bone]
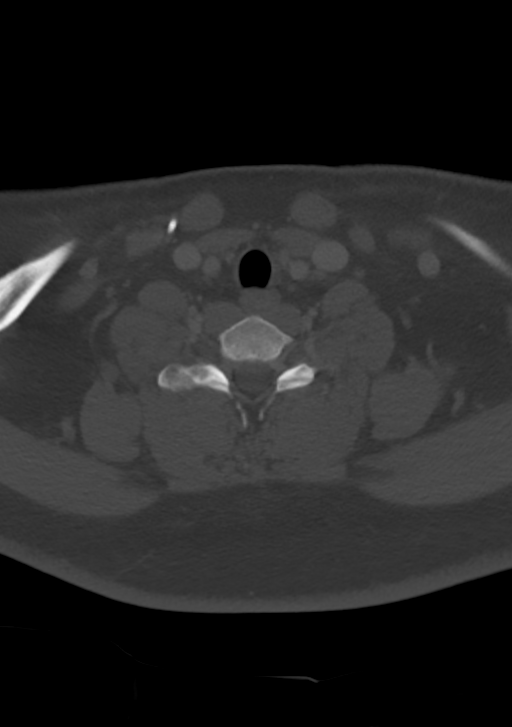
[im 102/136  bone]
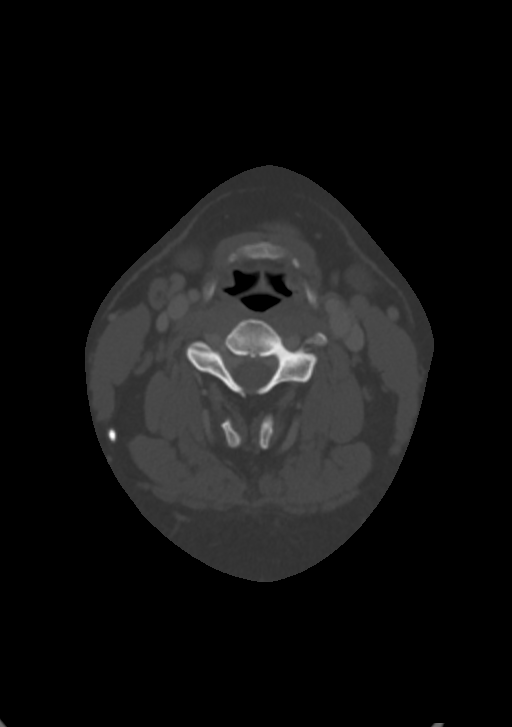

[14 of 35 positions shown; findings below may reference images not displayed]

RADIATION DOSE REDUCTION: This exam was performed according to the
departmental dose-optimization program which includes automated
exposure control, adjustment of the mA and/or kV according to
patient size and/or use of iterative reconstruction technique.

CONTRAST:  75mL OMNIPAQUE IOHEXOL 300 MG/ML  SOLN
FINDINGS: Pharynx and larynx: No appreciable swelling or discrete mass within
the oral cavity, pharynx or larynx. Punctate calcific focus within
the left palatine tonsil, which may reflect a postinflammatory
calcification or tonsillith. Unremarkable appearance of the
epiglottis. No retropharyngeal collection.

Salivary glands: No inflammation, mass, or stone.

Thyroid: Unremarkable.

Lymph nodes: No pathologically enlarged lymph nodes identified
within the neck.

Vascular: The major vascular structures of the neck are patent.

Limited intracranial: Separately reported on concurrently performed
non-contrast head CT.

Visualized orbits: Excluded from the field of view.

Mastoids and visualized paranasal sinuses: Mucous retention cysts
within the right maxillary sinus measuring up to 14 mm. Small mucous
retention cyst within the left maxillary sinus. No significant
mastoid effusion at the imaged levels.

Skeleton: No acute bony abnormality or aggressive osseous lesion.

Upper chest: No consolidation within the imaged lung apices.

Other: A VP shunt catheter traverses the right neck.
IMPRESSION: No appreciable swelling or discrete mass within the oral cavity,
pharynx or larynx. No retropharyngeal collection.

Punctate calcific focus within the left palatine tonsil, which may
reflect a postinflammatory calcification or tonsilloliths.

Mucous retention cysts within the bilateral maxillary sinuses.

## 2023-07-23 ENCOUNTER — Other Ambulatory Visit: Payer: Self-pay

## 2023-07-23 ENCOUNTER — Emergency Department (HOSPITAL_COMMUNITY)
Admission: EM | Admit: 2023-07-23 | Discharge: 2023-07-23 | Disposition: A | Discharge status: Home / Self Care | Payer: Medicaid Other | Attending: Emergency Medicine | Admitting: Emergency Medicine

## 2023-07-23 ENCOUNTER — Emergency Department (HOSPITAL_COMMUNITY): Payer: Medicaid Other

## 2023-07-23 DIAGNOSIS — W010XXA Fall on same level from slipping, tripping and stumbling without subsequent striking against object, initial encounter: Secondary | ICD-10-CM | POA: Insufficient documentation

## 2023-07-23 DIAGNOSIS — S4992XA Unspecified injury of left shoulder and upper arm, initial encounter: Secondary | ICD-10-CM | POA: Diagnosis present

## 2023-07-23 DIAGNOSIS — Y92009 Unspecified place in unspecified non-institutional (private) residence as the place of occurrence of the external cause: Secondary | ICD-10-CM | POA: Diagnosis not present

## 2023-07-23 MED ORDER — HYDROCODONE-ACETAMINOPHEN 5-325 MG PO TABS
1.0000 | ORAL_TABLET | Freq: Once | ORAL | Status: AC
  Administered 2023-07-23: 1 via ORAL
  Filled 2023-07-23: qty 1

## 2023-07-23 MED ORDER — HYDROCODONE-ACETAMINOPHEN 5-325 MG PO TABS: ORAL_TABLET | ORAL | 0 refills | Status: AC

## 2023-07-23 NOTE — ED Triage Notes (Signed)
Pt fell over a week ago but still has left shoulder pain. Pt has limited mobility d/t pain.

## 2023-07-23 NOTE — Discharge Instructions (Addendum)
X-ray of the shoulder today did not show any broken bones or dislocation.  Continue apply ice packs on and off to his shoulder.  You may continue to give ibuprofen with food every 6-8 hours.  He has been prescribed pain medication.  He may wear the sling as needed for support but do not wear continuously.  He will need to remove the sling and gently move his shoulder several times a day.  Please call the orthopedic provider listed to arrange follow-up appointment.

## 2023-07-24 NOTE — ED Provider Notes (Signed)
[place] EMERGENCY DEPARTMENT AT [facility] Provider Note   CSN: [id number] Arrival date & time: 07/23/23  1023     History  Chief Complaint  Patient presents with   Shoulder Injury    Derrick Morrison is a 27 y.o. male.   Shoulder Injury Pertinent negatives include no chest pain, no headaches and no shortness of breath.       Derrick Morrison is a 27 y.o. male with hx of benign brain tumor and underwent brain surgery and unfortunately suffered CVA during surgery, He presents to the Emergency Department complaining of left shoulder pain x 1 week.  He is accompanied by his father who also provides history.  Father states that patient tripped fell face forward in the home, fell with arms out stretched.  No LOC, vomiting or headaches or dizziness.  He has pain to the entire shoulder with movement.  Pain is minimal when arm is held to his side.  No neck pain, swelling or numbness or the extremity.pain not relieved by OTC pain medications    Home Medications Prior to Admission medications   Medication Sig Start Date End Date Taking? Authorizing Provider  HYDROcodone-acetaminophen (NORCO/VICODIN) 5-325 MG tablet Take one tab po q 4 hrs prn pain 07/23/23  Yes Amaiya Scruton, PA-C  ibuprofen (ADVIL) 600 MG tablet Take 1 tablet (600 mg total) by mouth every 6 (six) hours as needed. 09/18/21   Achille Rich, PA-C  levothyroxine (SYNTHROID) 50 MCG tablet Take 50 mcg by mouth daily before breakfast.    [provider]  methocarbamol (ROBAXIN) 500 MG tablet Take 1 tablet (500 mg total) by mouth 2 (two) times daily. Patient not taking: Reported on 12/26/2021 09/18/21   Achille Rich, PA-C  pantoprazole (PROTONIX) 20 MG tablet Take 20 mg by mouth daily. 12/21/21   [provider]  potassium chloride SA (KLOR-CON M) 10 MEQ tablet Take 2 tablets (20 mEq total) by mouth 2 (two) times daily for 4 days. 12/26/21 12/30/21  Cecil Cobbs, PA-C      Allergies    Codeine     Review of Systems   Review of Systems  Constitutional:  Negative for chills and fever.  Respiratory:  Negative for cough and shortness of breath.   Cardiovascular:  Negative for chest pain.  Gastrointestinal:  Negative for nausea and vomiting.  Musculoskeletal:  Positive for arthralgias (left shoulder pain). Negative for neck pain.  Skin:  Negative for wound.  Neurological:  Negative for dizziness, weakness, numbness and headaches.    Physical Exam Updated Vital Signs BP (!) 130/92   Pulse 92   Temp 98 F (36.7 C) (Oral)   Resp 15   Ht 5\' 2"  (1.575 m)   Wt 81.6 kg   SpO2 96%   BMI 32.92 kg/m  Physical Exam Vitals and nursing note reviewed.  Constitutional:      General: He is not in acute distress.    Appearance: Normal appearance. He is not ill-appearing or toxic-appearing.  HENT:     Head: Atraumatic.  Cardiovascular:     Rate and Rhythm: Normal rate and regular rhythm.     Pulses: Normal pulses.  Pulmonary:     Effort: Pulmonary effort is normal.  Chest:     Chest wall: No tenderness.  Abdominal:     Palpations: Abdomen is soft.     Tenderness: There is no abdominal tenderness.  Musculoskeletal:        General: Tenderness and signs of injury  present. No swelling or deformity.  Skin:    General: Skin is warm.     Capillary Refill: Capillary refill takes less than 2 seconds.  Neurological:     Mental Status: He is alert and oriented to person, place, and time.     Comments: Pt hx of CVA, limited movement of right UE, which is baseline per father.       ED Results / Procedures / Treatments   Labs (all labs ordered are listed, but only abnormal results are displayed) Labs Reviewed - No data to display  EKG None  Radiology DG Shoulder Left  Result Date: 07/23/2023 CLINICAL DATA:  Fall one-week ago with persistent left shoulder pain EXAM: LEFT SHOULDER - 3 VIEW COMPARISON:  None Available. FINDINGS: There is no evidence of fracture or dislocation.  There is no evidence of arthropathy or other focal bone abnormality. Soft tissues are unremarkable. Partially imaged catheter courses over the right lower chest/upper abdomen. IMPRESSION: No acute fracture or dislocation. Electronically Signed   By: Agustin Cree M.D.   On: 07/23/2023 11:32    Procedures Procedures    Medications Ordered in ED Medications  HYDROcodone-acetaminophen (NORCO/VICODIN) 5-325 MG per tablet 1 tablet (1 tablet Oral Given 07/23/23 1120)    ED Course/ Medical Decision Making/ A&P                                 Medical Decision Making Pt here for eval of left shoulder pain after a mechanical fall that occurred x 1 week ago.  Balance limited due to prior CVA per father.  Has pain with ROM of shoulder.  Denies numbness of weakness of the left UE.  Pt at neurological baseline per father.  Tender on ROM, I do not appreciate a step off or other bony deformity  Fx, dislocation, sprain,  rotator cuff, AC joint and labral injury considered.    Amount and/or Complexity of Data Reviewed Radiology: ordered.    Details: XR of the shoulder w/o fx or dislocation Discussion of management or test interpretation with external provider(s): Discussed XR findings with father. Sling applied for comfort.  Father agrees to continue symptomatic tx and close out pt f/u with orthopedics.  Instructions for sling use given.  Father requests f/u info with Dewaine Conger in [facility]  Risk Prescription drug management.           Final Clinical Impression(s) / ED Diagnoses Final diagnoses:  Injury of left shoulder, initial encounter    Rx / DC Orders ED Discharge Orders          Ordered    HYDROcodone-acetaminophen (NORCO/VICODIN) 5-325 MG tablet        07/23/23 1221              Pauline Aus, PA-C 07/24/23 1552    Gerhard Munch, MD 07/24/23 [phone]

## 2023-10-06 ENCOUNTER — Ambulatory Visit (HOSPITAL_COMMUNITY): Admission: RE | Admit: 2023-10-06 | Payer: Medicaid Other | Source: Ambulatory Visit

## 2023-10-06 ENCOUNTER — Other Ambulatory Visit (HOSPITAL_COMMUNITY): Payer: Self-pay | Admitting: Nurse Practitioner

## 2023-10-06 DIAGNOSIS — Z86718 Personal history of other venous thrombosis and embolism: Secondary | ICD-10-CM

## 2023-10-07 ENCOUNTER — Ambulatory Visit (HOSPITAL_COMMUNITY)
Admission: RE | Admit: 2023-10-07 | Discharge: 2023-10-07 | Disposition: A | Discharge status: Home / Self Care | Payer: Medicaid Other | Source: Ambulatory Visit | Attending: Nurse Practitioner | Admitting: Nurse Practitioner

## 2023-10-07 DIAGNOSIS — Z86718 Personal history of other venous thrombosis and embolism: Secondary | ICD-10-CM | POA: Diagnosis present

## 2023-10-07 DIAGNOSIS — Z8639 Personal history of other endocrine, nutritional and metabolic disease: Secondary | ICD-10-CM | POA: Diagnosis present

## 2023-10-10 ENCOUNTER — Other Ambulatory Visit (HOSPITAL_COMMUNITY): Payer: Self-pay | Admitting: Nurse Practitioner

## 2023-10-10 DIAGNOSIS — R52 Pain, unspecified: Secondary | ICD-10-CM

## 2023-10-28 ENCOUNTER — Ambulatory Visit (HOSPITAL_COMMUNITY)
Admission: RE | Admit: 2023-10-28 | Discharge: 2023-10-28 | Disposition: A | Discharge status: Home / Self Care | Payer: Medicaid Other | Source: Ambulatory Visit | Attending: Nurse Practitioner | Admitting: Nurse Practitioner

## 2023-10-28 DIAGNOSIS — R52 Pain, unspecified: Secondary | ICD-10-CM | POA: Insufficient documentation

## 2023-11-24 ENCOUNTER — Other Ambulatory Visit: Payer: Self-pay

## 2023-11-24 ENCOUNTER — Ambulatory Visit (HOSPITAL_COMMUNITY): Payer: Medicaid Other | Attending: Nurse Practitioner

## 2023-11-24 DIAGNOSIS — M79605 Pain in left leg: Secondary | ICD-10-CM | POA: Diagnosis present

## 2023-11-24 DIAGNOSIS — R2689 Other abnormalities of gait and mobility: Secondary | ICD-10-CM

## 2023-11-24 DIAGNOSIS — R262 Difficulty in walking, not elsewhere classified: Secondary | ICD-10-CM | POA: Diagnosis present

## 2023-11-24 NOTE — Therapy (Signed)
 OUTPATIENT PHYSICAL THERAPY LOWER EXTREMITY EVALUATION    Patient Name: Derrick Morrison MRN: [id number] DOB:01/17/1996, 28 y.o., male Today's Date: 11/24/2023  END OF SESSION:  PT End of Session - 11/24/23 1432     Visit Number 1    Number of Visits 8    Date for PT Re-Evaluation 12/23/23    Authorization Type Medicaid [place]    Authorization Time Period auth submitted please check    PT Start Time 1433    PT Stop Time 1510    PT Time Calculation (min) 37 min    Activity Tolerance Patient tolerated treatment well    Behavior During Therapy WFL for tasks assessed/performed             Past Medical History:  Diagnosis Date   Brain tumor (benign) (HCC)    Stroke (HCC)    Thyroid disease    Past Surgical History:  Procedure Laterality Date   VENTRICULOPERITONEAL SHUNT Right    There are no active problems to display for this patient.   PCP: Benita Stabile, MD  REFERRING PROVIDER: Micael Hampshire, FNP  REFERRING DIAG:  Diagnosis  M79.605 (ICD-10-CM) - Pain in left leg    THERAPY DIAG:  Pain in left leg  Difficulty in walking, not elsewhere classified  Rationale for Evaluation and Treatment: Rehabilitation  ONSET DATE: beginning of the year  SUBJECTIVE:   SUBJECTIVE STATEMENT: *he cannot read small print; reads on a 1st grade level; his parents mostly read for him" arrives with his dad Alfredo Bach ; patient with history of stroke with right side weakness.  Saw MD about left leg pain and "they forgot to put referral in"; so  had to go back to get another referral.  Insidious onset.  Weaker on right side than left from previous CVA.  Falls quite frequently per his dad and most of the time lands on the left side.  Sleeps in recliner for the past year.  Reports most pain with sit to stand; limited vision right eye  PERTINENT HISTORY: Hx of benign brain tumor and subsequent CVA  shunt PAIN:  Are you having pain? Yes: NPRS scale: 5/10 Pain location: left hip area; groin  area Pain description: "just hurts" Aggravating factors: hurts to go from sitting to standing Relieving factors: tylenol, ibuprophen  PRECAUTIONS: Fall    WEIGHT BEARING RESTRICTIONS: No  FALLS:  Has patient fallen in last 6 months? Yes. Number of falls 7-8  LIVING ENVIRONMENT: Lives with: lives with their family Lives in: House/apartment Stairs: Yes: Internal: 3 steps; on right going up, on left going up, and can reach both and External: 8 steps; on right going up, on left going up, and can reach both but only use left arm Has following equipment at home: None  OCCUPATION: does not work, disability  PLOF: Needs assistance with ADLs dressing; dad assists in bathing and dressing  PATIENT GOALS: don't fall; get my leg better  NEXT MD VISIT: follow up PRN  OBJECTIVE:  Note: Objective measures were completed at Evaluation unless otherwise noted.  DIAGNOSTIC FINDINGS: CLINICAL DATA:  Left hip pain   EXAM: DG HIP (WITH OR WITHOUT PELVIS) 2-3V LEFT   COMPARISON:  None Available.   FINDINGS: There is no evidence of hip fracture or dislocation. There is no evidence of arthropathy or other focal bone abnormality. Bony pelvis and hips are symmetric and intact. SI joints maintained. No diastasis. VP shunt tubing noted over the lower abdomen. Artifact suspected over the pelvic midline.  IMPRESSION: No acute finding by plain radiography.  PATIENT SURVEYS:  LEFS 29/80  36.3%  COGNITION: Overall cognitive status: Impaired and History of cognitive impairments - at baseline     SENSATION: No numbness or tingling noted  EDEMA:  None noted   POSTURE: rounded shoulders, forward head, and decreased lumbar lordosis  PALPATION:   LOWER EXTREMITY ROM:  Lumbar flexion fingertips to knees; lumbar extension 20% available  Active ROM Right eval Left eval  Hip flexion    Hip extension    Hip abduction    Hip adduction    Hip internal rotation    Hip external rotation     Knee flexion    Knee extension    Ankle dorsiflexion    Ankle plantarflexion    Ankle inversion    Ankle eversion     (Blank rows = not tested)  LOWER EXTREMITY MMT:  MMT Right eval Left eval  Hip flexion 3- 4+  Hip extension    Hip abduction    Hip adduction    Hip internal rotation    Hip external rotation    Knee flexion    Knee extension 4 5  Ankle dorsiflexion Drop foot; no active contraction noted in sitting 5  Ankle plantarflexion    Ankle inversion    Ankle eversion     (Blank rows = not tested)   FUNCTIONAL TESTS:  5 times sit to stand: 38.43 sec using left hand to assist up to standing  GAIT: Distance walked: 50 ft in clinic Assistive device utilized: None Level of assistance: CGA and Min A Comments: needs assist for safety; drop foot right; and left foot overpronation; left knee valgus.                                                                                                                                  TREATMENT DATE: 11/24/23 physical therapy evaluation and HEP instruction    PATIENT EDUCATION:  Education details: Patient educated on exam findings, POC, scope of PT, HEP, and discussed AFO, walking stick or cane with patient and father present. Also discussed with him sleeping in the bed versus recliner.   Person educated: Patient Education method: Explanation, Demonstration, and Handouts Education comprehension: verbalized understanding, returned demonstration, verbal cues required, and tactile cues required HOME EXERCISE PROGRAM: Access Code: [email or web address] URL: [email or web address] Date: 11/24/2023 Prepared by: AP - Rehab  Exercises - Sit to Stand with Armchair  - 2 x daily - 7 x weekly - 2 sets - 5 reps - Standing Lumbar Extension with Counter  - 2 x daily - 7 x weekly - 1 sets - 10 reps  ASSESSMENT:  CLINICAL IMPRESSION: Patient is a 28 y.o. male who was seen today for physical therapy evaluation and treatment for  M79.605 (ICD-10-CM) - Pain in left leg.Patient demonstrates muscle weakness, reduced ROM, and fascial restrictions which are likely contributing to symptoms of pain and are negatively impacting  patient ability to perform ADLs and functional mobility tasks. Suspect patient also with lumbar involvement.  Discussed with him and his dad the benefit of an AFO; had one but "grew out of it"; also discussed use of walking stick or cane for safety.   Patient will benefit from skilled physical therapy services to address these deficits to reduce pain and improve level of function with ADLs and functional mobility tasks.  .   OBJECTIVE IMPAIRMENTS: Abnormal gait, decreased activity tolerance, decreased balance, decreased mobility, difficulty walking, decreased ROM, decreased strength, decreased safety awareness, increased fascial restrictions, impaired perceived functional ability, impaired vision/preception, and pain.   ACTIVITY LIMITATIONS: carrying, lifting, bending, standing, squatting, sleeping, stairs, transfers, bed mobility, bathing, toileting, dressing, hygiene/grooming, and locomotion level  PARTICIPATION LIMITATIONS: meal prep, cleaning, shopping, and community activity  PERSONAL FACTORS: 1 comorbidity: CVA with right side weakness  are also affecting patient's functional outcome.   REHAB POTENTIAL: Good  CLINICAL DECISION MAKING: Evolving/moderate complexity  EVALUATION COMPLEXITY: Moderate   GOALS: Goals reviewed with patient? No  SHORT TERM GOALS: Target date: 12/09/23 Patient with caregiver assist will be independent with initial HEP  Baseline: Goal status: INITIAL  2.  Patient will report 30% improvement overall  Baseline:  Goal status: INITIAL  LONG TERM GOALS: Target date: 12/23/2023  Patient with caregiver assist will be independent in self management strategies to improve quality of life and functional outcomes.  Baseline:  Goal status: INITIAL  2.  Patient will report  50% improvement overall  Baseline:  Goal status: INITIAL  3.  Patient will improve LEFS score by 5 points to demonstrate improved perceived function Baseline: 29/80 Goal status: INITIAL  4.  Patient will improve 5 times sit to stand score to 30 sec or less with pain 2/10 or less to demonstrate improved functional mobility and increased leg strength. Baseline: 38.43 Goal status: INITIAL  5.  Patient will ambulate household distances with appropriate AD and adaptive equipment safely and remain free of falls Baseline:  Goal status: INITIAL   PLAN:  PT FREQUENCY: 2x/week  PT DURATION: 4 weeks  PLANNED INTERVENTIONS: 97164- PT Re-evaluation, 97110-Therapeutic exercises, 97530- Therapeutic activity, 97112- Neuromuscular re-education, 97535- Self Care, 16109- Manual therapy, [phone]- Gait training, [phone]- Orthotic Fit/training, [phone]- Canalith repositioning, [id number]- Aquatic Therapy, [phone]- Splinting, Patient/Family education, Balance training, Stair training, Taping, Dry Needling, Joint mobilization, Joint manipulation, Spinal manipulation, Spinal mobilization, Scar mobilization, and DME instructions.   PLAN FOR NEXT SESSION: Review HEP and goals; test hip flexion tightness/thomas test; test SLS; patient needs A with walking for safety; poor safety awareness; test hip extension and knee flexion if able to tolerate prone; lower extremity strengthening, functional activity and balance.  Question lumbar involvement  3:21 PM, 11/24/23 Payton Moder Small Airi Copado MPT [place] [facility] [employer] [phone]

## 2023-11-29 ENCOUNTER — Encounter (HOSPITAL_COMMUNITY)

## 2023-12-02 ENCOUNTER — Ambulatory Visit (HOSPITAL_COMMUNITY): Admitting: Physical Therapy

## 2023-12-02 DIAGNOSIS — M79605 Pain in left leg: Secondary | ICD-10-CM

## 2023-12-02 DIAGNOSIS — R2689 Other abnormalities of gait and mobility: Secondary | ICD-10-CM

## 2023-12-02 DIAGNOSIS — R262 Difficulty in walking, not elsewhere classified: Secondary | ICD-10-CM

## 2023-12-02 NOTE — Therapy (Signed)
 OUTPATIENT PHYSICAL THERAPY LOWER EXTREMITY EVALUATION    Patient Name: Derrick Morrison MRN: [id number] DOB:01/14/1996, 28 y.o., male Today's Date: 12/02/2023  END OF SESSION:  PT End of Session - 12/02/23 1556     Visit Number 2    Number of Visits 8    Date for PT Re-Evaluation 12/23/23    Authorization Type medicare/caid no auth needed    Authorization Time Period auth submitted please check    Progress Note Due on Visit 8    PT Start Time 1517    PT Stop Time 1557    PT Time Calculation (min) 40 min    Activity Tolerance Patient tolerated treatment well    Behavior During Therapy WFL for tasks assessed/performed              Past Medical History:  Diagnosis Date   Brain tumor (benign) (HCC)    Stroke (HCC)    Thyroid disease    Past Surgical History:  Procedure Laterality Date   VENTRICULOPERITONEAL SHUNT Right    There are no active problems to display for this patient.   PCP: Benita Stabile, MD  REFERRING PROVIDER: Micael Hampshire, FNP  REFERRING DIAG:  Diagnosis  M79.605 (ICD-10-CM) - Pain in left leg    THERAPY DIAG:  Pain in left leg  Difficulty in walking, not elsewhere classified  Other abnormalities of gait and mobility  Rationale for Evaluation and Treatment: Rehabilitation  ONSET DATE: beginning of the year  SUBJECTIVE: Pt states that he did not do his exercises as the exercises due to the exercises causing him increased pain.  Pt states that he has quite a bit of Lt shoulder pain as well   SUBJECTIVE STATEMENT: *he cannot read small print; reads on a 1st grade level; his parents mostly read for him" arrives with his dad Alfredo Bach ; patient with history of stroke with right side weakness.  Saw MD about left leg pain and "they forgot to put referral in"; so  had to go back to get another referral.  Insidious onset.  Weaker on right side than left from previous CVA.  Falls quite frequently per his dad and most of the time lands on the left side.   Sleeps in recliner for the past year.  Reports most pain with sit to stand; limited vision right eye  PERTINENT HISTORY: Hx of benign brain tumor and subsequent CVA  shunt PAIN:  Are you having pain? Yes: NPRS scale: 5/10 Pain location: left hip area; groin area Pain description: "just hurts" Aggravating factors: hurts to go from sitting to standing Relieving factors: tylenol, ibuprophen  PRECAUTIONS: Fall    WEIGHT BEARING RESTRICTIONS: No  FALLS:  Has patient fallen in last 6 months? Yes. Number of falls 7-8  LIVING ENVIRONMENT: Lives with: lives with their family Lives in: House/apartment Stairs: Yes: Internal: 3 steps; on right going up, on left going up, and can reach both and External: 8 steps; on right going up, on left going up, and can reach both but only use left arm Has following equipment at home: None  OCCUPATION: does not work, disability  PLOF: Needs assistance with ADLs dressing; dad assists in bathing and dressing  PATIENT GOALS: don't fall; get my leg better  NEXT MD VISIT: follow up PRN  OBJECTIVE:  Note: Objective measures were completed at Evaluation unless otherwise noted.  DIAGNOSTIC FINDINGS: CLINICAL DATA:  Left hip pain   EXAM: DG HIP (WITH OR WITHOUT PELVIS) 2-3V LEFT  COMPARISON:  None Available.   FINDINGS: There is no evidence of hip fracture or dislocation. There is no evidence of arthropathy or other focal bone abnormality. Bony pelvis and hips are symmetric and intact. SI joints maintained. No diastasis. VP shunt tubing noted over the lower abdomen. Artifact suspected over the pelvic midline.   IMPRESSION: No acute finding by plain radiography.  PATIENT SURVEYS:  LEFS 29/80  36.3%  COGNITION: Overall cognitive status: Impaired and History of cognitive impairments - at baseline     SENSATION: No numbness or tingling noted   POSTURE: rounded shoulders, forward head, and decreased lumbar lordosis    LOWER EXTREMITY  ROM:  Lumbar flexion fingertips to knees; lumbar extension 20% available  LOWER EXTREMITY MMT:  MMT Right eval 12/02/23 Left eval 12/02/23  Hip flexion 3-  4+   Hip extension  3  3  Hip abduction      Hip adduction      Hip internal rotation      Hip external rotation      Knee flexion  5  5  Knee extension 4  5   Ankle dorsiflexion Drop foot; no active contraction noted in sitting  5   Ankle plantarflexion      Ankle inversion      Ankle eversion       (Blank rows = not tested)   FUNCTIONAL TESTS:  5 times sit to stand: 38.43 sec using left hand to assist up to standing  GAIT: Distance walked: 50 ft in clinic Assistive device utilized: None Level of assistance: CGA and Min A Comments: needs assist for safety; drop foot right; and left foot overpronation; left knee valgus.                                                                                                                                  TREATMENT DATE: 12/02/23  Supine:  Knee to chest x 20" x 3 Bridge x 10  POE x 2 minutes  Prone: POE  Single leg raise x 10  Sit to stand x 10  Standing :   Side step x 2 RT Heel raise at counter x 10 Nustep level 3 x 6:00 no UE     PATIENT EDUCATION:  Education details: Patient educated on exam findings, POC, scope of PT, HEP, and discussed AFO, walking stick or cane with patient and father present. Also discussed with him sleeping in the bed versus recliner.   Person educated: Patient Education method: Explanation, Demonstration, and Handouts Education comprehension: verbalized understanding, returned demonstration, verbal cues required, and tactile cues required HOME EXERCISE PROGRAM: Access Code: [email or web address] URL: [email or web address] 12/02/23 Access Code: [email or web address] URL: [email or web address] Date: 12/02/2023 Prepared by: Virgina Organ  Exercises - Supine Bridge  - 2 x daily - 7 x weekly - 1 sets - 10 reps - 3" hold - Supine  Piriformis Stretch with Foot on Ground  - 2 x daily -  7 x weekly - 1 sets - 3 reps - 30" hold - Supine Single Knee to Chest Stretch  - 2 x daily - 7 x weekly - 1 sets - 10 reps - 30" hold  Date: 11/24/2023 Prepared by: AP - Rehab  Exercises - Sit to Stand with Armchair  - 2 x daily - 7 x weekly - 2 sets - 5 reps - Standing Lumbar Extension with Counter  - 2 x daily - 7 x weekly - 1 sets - 10 reps  ASSESSMENT:  CLINICAL IMPRESSION: Therapist reviewed goals,  tested hip extension as well as hamstring strength with noted hip weakness.  Updated HEP.   Patient demonstrates muscle weakness, reduced ROM, and fascial restrictions which are likely contributing to symptoms of pain and are negatively impacting patient ability to perform ADLs and functional mobility tasks.   Patient will benefit from skilled physical therapy services to address these deficits to reduce pain and improve level of function with ADLs and functional mobility tasks.  .   OBJECTIVE IMPAIRMENTS: Abnormal gait, decreased activity tolerance, decreased balance, decreased mobility, difficulty walking, decreased ROM, decreased strength, decreased safety awareness, increased fascial restrictions, impaired perceived functional ability, impaired vision/preception, and pain.   ACTIVITY LIMITATIONS: carrying, lifting, bending, standing, squatting, sleeping, stairs, transfers, bed mobility, bathing, toileting, dressing, hygiene/grooming, and locomotion level  PARTICIPATION LIMITATIONS: meal prep, cleaning, shopping, and community activity  PERSONAL FACTORS: 1 comorbidity: CVA with right side weakness  are also affecting patient's functional outcome.   REHAB POTENTIAL: Good  CLINICAL DECISION MAKING: Evolving/moderate complexity  EVALUATION COMPLEXITY: Moderate   GOALS: Goals reviewed with patient? No  SHORT TERM GOALS: Target date: 12/09/23 Patient with caregiver assist will be independent with initial HEP  Baseline: Goal  status: on-going   2.  Patient will report 30% improvement overall  Baseline:  Goal status: on-going   LONG TERM GOALS: Target date: 12/23/2023  Patient with caregiver assist will be independent in self management strategies to improve quality of life and functional outcomes.  Baseline:  Goal status: on-going   2.  Patient will report 50% improvement overall  Baseline:  Goal status:on-going   3.  Patient will improve LEFS score by 5 points to demonstrate improved perceived function Baseline: 29/80 Goal status: on-going   4.  Patient will improve 5 times sit to stand score to 30 sec or less with pain 2/10 or less to demonstrate improved functional mobility and increased leg strength. Baseline: 38.43 Goal status: on-going   5.  Patient will ambulate household distances with appropriate AD and adaptive equipment safely and remain free of falls Baseline:  Goal status: on-going    PLAN:  PT FREQUENCY: 2x/week  PT DURATION: 4 weeks  PLANNED INTERVENTIONS: 97164- PT Re-evaluation, 97110-Therapeutic exercises, 97530- Therapeutic activity, 97112- Neuromuscular re-education, 97535- Self Care, 16109- Manual therapy, [phone]- Gait training, [phone]- Orthotic Fit/training, [phone]- Canalith repositioning, [id number]- Aquatic Therapy, [phone]- Splinting, Patient/Family education, Balance training, Stair training, Taping, Dry Needling, Joint mobilization, Joint manipulation, Spinal manipulation, Spinal mobilization, Scar mobilization, and DME instructions.   PLAN FOR NEXT SESSION: add sitting piriformis stretch   Virgina Organ, PT CLT [phone]

## 2023-12-07 ENCOUNTER — Encounter (HOSPITAL_COMMUNITY)

## 2023-12-09 ENCOUNTER — Encounter (HOSPITAL_COMMUNITY)

## 2023-12-09 ENCOUNTER — Encounter (HOSPITAL_COMMUNITY): Payer: Self-pay

## 2023-12-09 NOTE — Therapy (Signed)
 [facility] Outpatient Rehabilitation at [facility] [address] [place], [place], [address] Phone: [phone]   Fax:  [phone]  Patient Details  Name: Derrick Morrison MRN: [id number] Date of Birth: 1996/07/19 Referring Provider:  No ref. provider found  Encounter Date: 12/09/2023  Called patient regarding appointment for today.  Patient previously no-showed on Wednesday, 12/07/2023 for other therapist.  Secure chat message regarding patient wanting to quit physical therapy and not return from therapist.  Called patient to confirm reason why he wanted to quit physical therapy, patient reporting that it was not helping his lower extremity and no longer wants to come.  Patient abruptly got before therapist could finish conversation.  Patient will be discharged from PT services.  PHYSICAL THERAPY DISCHARGE SUMMARY  Visits from Start of Care: 2  Current functional level related to goals / functional outcomes: Unknown   Remaining deficits: LLE weakness ROM deficits   Education / Equipment: NA   Patient agrees to discharge. Patient goals were not met. Patient is being discharged due to  desire to not return due to lack of progress.  Nelida Meuse, PT 12/09/2023, 1:13 PM  [facility] Outpatient Rehabilitation at [facility] [address] [place], [place], [address] Phone: [phone]   Fax:  [phone]

## 2023-12-13 ENCOUNTER — Encounter (HOSPITAL_COMMUNITY)

## 2023-12-15 ENCOUNTER — Encounter (HOSPITAL_COMMUNITY): Admitting: Physical Therapy

## 2023-12-20 ENCOUNTER — Encounter (HOSPITAL_COMMUNITY)

## 2023-12-22 ENCOUNTER — Encounter (HOSPITAL_COMMUNITY)

## 2024-02-09 ENCOUNTER — Encounter: Payer: Self-pay | Admitting: Internal Medicine

## 2024-06-08 NOTE — Progress Notes (Signed)
 Otolaryngology Clinic Note  HPI:    Derrick Morrison is a 28 y.o. male who presents as a new patient.  Derrick Morrison presents today for a check of his ears.  His father accompanies him on today's visit he had had recent ear infection that was treated with drops.  He is no longer experiencing any pain or drainage.  He states his hearing is good.  No history of chronic ear problems.  He does not smoke.  PMH/Meds/All/SocHx/FamHx/ROS:   Medical History[1]  Surgical History[2]  No family history of bleeding disorders, wound healing problems or difficulty with anesthesia.      Current Medications[3]  A complete ROS was performed with pertinent positives/negatives noted in the HPI. The remainder of the ROS are negative.    Physical Exam:    Temp 97.3 F (36.3 C) (Temporal)   Ht 1.575 m (5' 2)   Wt 90 kg (198 lb 6.4 oz)   BMI 36.29 kg/m   Constitutional:  Patient appears well-nourished and well-developed. No acute distress.   Head/Face: Facial features are symmetric. Skull is normocephalic. Hair and scalp are normal. Normal temporal artery pulses. TMJ shows no joint deformity swelling or erythema.   Eyes: Pupils are equal, round and reactive to light. Conjunctiva and lids are normal. Normal extraocular mobility. Normal vision by patient report.   Ears:     Right: Pinna normal.  Minimal meatal dermatitis, normal ear canal skin and caliber without excessive cerumen or drainage. Tympanic membranes intact without effusion or infection. Hearing normal.    Left: Pinna normal.  Minimal meatal dermatitis, normal ear canal skin and caliber without excessive cerumen or drainage. Tympanic membranes intact without effusion or infection. Hearing normal.   Nose/Sinus/Nasopharynx: Septum is normal. Normal nasal mucosa. Normal inferior turbinates.    Oral cavity/Oropharynx: Lips normal, teeth and gums normal with good dentition, normal oral vestibule. Normal floor of mouth, tongue and oral  mucosa, no mucosal lesions, ulcer or mass, normal tongue mobility.  Hard and soft palate normal with normal mobility. One plus tonsils, no erythema or exudate. Base of tongue, retromolar trigone and oral pharynx normal. Normal sensation, mobility and gag.   Neck: No cervical lymphadenopathy, mass or swelling. Salivary glands normal to palpation without swelling, erythema or mass. Normal facial nerve function. Normal thyroid gland palpation.   Neurological: Alert and oriented to self, place and time.  Normal reflexes and motor skills, balance and coordination.   Psychiatric: No unusual anxiety or evidence of depression. Appropriate affect.     Independent Review of Additional Tests or Records:  None  Procedures:  None   Impression & Plans:   1) otitis externa-resolved 2) meatal dermatitis-bilateral   May use 1% hydrocortisone cream as needed for meatal dermatitis Will be happy to recheck as needed  Derrick RAMAN. Spainhour, PA-C [facility]        [1] Past Medical History: Diagnosis Date  . Brain injury (CMD)   [2] History reviewed. No pertinent surgical history. [3]  Current Outpatient Medications:  .  metoprolol succinate (TOPROL XL) 50 mg 24 hr tablet, Take 1 tablet by mouth daily., Disp: , Rfl:  .  olmesartan (BENICAR) 20 mg tablet, Take 1 tablet by mouth daily., Disp: , Rfl:  .  potassium chloride  (KLOR-CON ) 20 mEq ER tablet, Take 1 tablet by mouth daily., Disp: , Rfl:  .  Synthroid 50 mcg tablet, Take 50 mcg by mouth every morning., Disp: , Rfl:  .  Voquezna 10 mg tab, Take 1 tablet  by mouth daily., Disp: , Rfl:

## 2024-06-19 ENCOUNTER — Ambulatory Visit (HOSPITAL_COMMUNITY)
Admission: RE | Admit: 2024-06-19 | Discharge: 2024-06-19 | Disposition: A | Discharge status: Home / Self Care | Source: Ambulatory Visit

## 2024-06-19 ENCOUNTER — Other Ambulatory Visit (HOSPITAL_COMMUNITY): Payer: Self-pay

## 2024-06-19 DIAGNOSIS — M25551 Pain in right hip: Secondary | ICD-10-CM

## 2024-06-27 ENCOUNTER — Other Ambulatory Visit (HOSPITAL_COMMUNITY): Payer: Self-pay

## 2024-06-27 DIAGNOSIS — M545 Low back pain, unspecified: Secondary | ICD-10-CM

## 2024-06-27 DIAGNOSIS — M25551 Pain in right hip: Secondary | ICD-10-CM

## 2024-06-29 ENCOUNTER — Other Ambulatory Visit (HOSPITAL_COMMUNITY): Payer: Self-pay

## 2024-06-29 ENCOUNTER — Encounter (HOSPITAL_COMMUNITY): Payer: Self-pay | Admitting: Radiology

## 2024-06-29 DIAGNOSIS — M545 Low back pain, unspecified: Secondary | ICD-10-CM

## 2024-06-29 DIAGNOSIS — M25551 Pain in right hip: Secondary | ICD-10-CM

## 2024-06-30 ENCOUNTER — Encounter (HOSPITAL_COMMUNITY): Payer: Self-pay

## 2024-06-30 ENCOUNTER — Ambulatory Visit (HOSPITAL_COMMUNITY): Admission: RE | Admit: 2024-06-30 | Source: Ambulatory Visit

## 2024-07-01 ENCOUNTER — Ambulatory Visit (HOSPITAL_COMMUNITY): Admission: RE | Admit: 2024-07-01 | Discharge: 2024-07-01 | Disposition: A | Source: Ambulatory Visit

## 2024-07-01 DIAGNOSIS — M25551 Pain in right hip: Secondary | ICD-10-CM | POA: Insufficient documentation

## 2024-08-01 NOTE — Therapy (Incomplete)
 OUTPATIENT PHYSICAL THERAPY LOWER EXTREMITY EVALUATION   Patient Name: Derrick Morrison MRN: [id number] DOB:May 06, 1996, 28 y.o., male Today's Date: 08/01/2024  END OF SESSION:   Past Medical History:  Diagnosis Date   Brain tumor (benign) (HCC)    Stroke [facility])    Thyroid disease    Past Surgical History:  Procedure Laterality Date   VENTRICULOPERITONEAL SHUNT Right    Placed in 2002 at [facility] in [place] followed Dr. Clarence at [facility] (2025)   There are no active problems to display for this patient.   PCP: ***  REFERRING PROVIDER: ***  REFERRING DIAG: ***  THERAPY DIAG:  No diagnosis found.  Rationale for Evaluation and Treatment: {HABREHAB:27488}  ONSET DATE: ***  SUBJECTIVE:   SUBJECTIVE STATEMENT: ***  PERTINENT HISTORY: *** PAIN:  Are you having pain? {OPRCPAIN:27236}  PRECAUTIONS: {Therapy precautions:24002}  RED FLAGS: {PT Red Flags:29287}   WEIGHT BEARING RESTRICTIONS: {Yes ***/No:24003}  FALLS:  Has patient fallen in last 6 months? {fallsyesno:27318}  LIVING ENVIRONMENT: Lives with: {[facility] lives with:25569::lives with their family} Lives in: {Lives in:25570} Stairs: {opstairs:27293} Has following equipment at home: {Assistive devices:23999}  OCCUPATION: ***  PLOF: {PLOF:24004}  PATIENT GOALS: ***  NEXT MD VISIT: ***  OBJECTIVE:  Note: Objective measures were completed at Evaluation unless otherwise noted.  DIAGNOSTIC FINDINGS: ***  PATIENT SURVEYS:  {rehab surveys:24030}  COGNITION: Overall cognitive status: {cognition:24006}     SENSATION: {sensation:27233}  EDEMA:  {edema:24020}  MUSCLE LENGTH: Hamstrings: Right *** deg; Left *** deg Debby test: Right *** deg; Left *** deg  POSTURE: {posture:25561}  PALPATION: ***  LOWER EXTREMITY ROM:  {AROM/PROM:27142} ROM Right eval Left eval  Hip flexion    Hip extension    Hip abduction    Hip adduction    Hip internal rotation    Hip external rotation    Knee  flexion    Knee extension    Ankle dorsiflexion    Ankle plantarflexion    Ankle inversion    Ankle eversion     (Blank rows = not tested)  LOWER EXTREMITY MMT:  MMT Right eval Left eval  Hip flexion    Hip extension    Hip abduction    Hip adduction    Hip internal rotation    Hip external rotation    Knee flexion    Knee extension    Ankle dorsiflexion    Ankle plantarflexion    Ankle inversion    Ankle eversion     (Blank rows = not tested)  LOWER EXTREMITY SPECIAL TESTS:  {LEspecialtests:26242}  FUNCTIONAL TESTS:  {Functional tests:24029}  GAIT: Distance walked: *** Assistive device utilized: {Assistive devices:23999} Level of assistance: {Levels of assistance:24026} Comments: ***                                                                                                                                TREATMENT DATE: ***    PATIENT EDUCATION:  Education details: ***  Person educated: {Person educated:25204} Education method: {Education Method:25205} Education comprehension: {Education Comprehension:25206}  HOME EXERCISE PROGRAM: ***  ASSESSMENT:  CLINICAL IMPRESSION: Patient is a *** y.o. *** who was seen today for physical therapy evaluation and treatment for ***.   OBJECTIVE IMPAIRMENTS: {opptimpairments:25111}.   ACTIVITY LIMITATIONS: {activitylimitations:27494}  PARTICIPATION LIMITATIONS: {participationrestrictions:25113}  PERSONAL FACTORS: {Personal factors:25162} are also affecting patient's functional outcome.   REHAB POTENTIAL: {rehabpotential:25112}  CLINICAL DECISION MAKING: {clinical decision making:25114}  EVALUATION COMPLEXITY: {Evaluation complexity:25115}   GOALS: Goals reviewed with patient? {yes/no:20286}  SHORT TERM GOALS: Target date: *** *** Baseline: Goal status: INITIAL  2.  *** Baseline:  Goal status: INITIAL  3.  *** Baseline:  Goal status: INITIAL  4.  *** Baseline:  Goal status: INITIAL  5.   *** Baseline:  Goal status: INITIAL  6.  *** Baseline:  Goal status: INITIAL  LONG TERM GOALS: Target date: ***  *** Baseline:  Goal status: INITIAL  2.  *** Baseline:  Goal status: INITIAL  3.  *** Baseline:  Goal status: INITIAL  4.  *** Baseline:  Goal status: INITIAL  5.  *** Baseline:  Goal status: INITIAL  6.  *** Baseline:  Goal status: INITIAL   PLAN:  PT FREQUENCY: {rehab frequency:25116}  PT DURATION: {rehab duration:25117}  PLANNED INTERVENTIONS: {rehab planned interventions:25118::97110-Therapeutic exercises,97530- Therapeutic [phone]- Neuromuscular re-education,97535- Self Rjmz,02859- Manual therapy,Patient/Family education}  PLAN FOR NEXT SESSION: ***   Greig GORMAN Quivers, PT 08/01/2024, 1:14 PM

## 2024-08-06 ENCOUNTER — Telehealth (HOSPITAL_COMMUNITY): Payer: Self-pay

## 2024-08-06 ENCOUNTER — Ambulatory Visit (HOSPITAL_COMMUNITY)

## 2024-08-06 NOTE — Telephone Encounter (Signed)
 Called and spoke with patient's father, Derrick Morrison.  He states they have decided not to pursue physical therapy at this time as Derrick Morrison in feeling a little better.  He apologizes for not calling to cancel.  PT instructs him to obtain another referral if they decide to pursue therapy services.  2:40 PM, 08/06/24 Derrick Morrison Derrick Morrison MPT [facility] [place] [phone]

## 2024-09-27 ENCOUNTER — Other Ambulatory Visit (HOSPITAL_COMMUNITY): Payer: Self-pay

## 2024-09-27 DIAGNOSIS — M25562 Pain in left knee: Secondary | ICD-10-CM

## 2024-09-28 ENCOUNTER — Ambulatory Visit (HOSPITAL_COMMUNITY): Admission: RE | Admit: 2024-09-28 | Discharge: 2024-09-28 | Disposition: A | Source: Ambulatory Visit

## 2024-09-28 DIAGNOSIS — M25562 Pain in left knee: Secondary | ICD-10-CM | POA: Insufficient documentation
